# Patient Record
Sex: Female | Born: 1965 | Race: White | Hispanic: No | Smoking: Current every day smoker
Health system: Southern US, Community
[De-identification: ages and names within clinical notes are randomized; demographics above are authoritative.]

## PROBLEM LIST (undated history)

## (undated) DIAGNOSIS — N809 Endometriosis, unspecified: Secondary | ICD-10-CM

## (undated) DIAGNOSIS — B009 Herpesviral infection, unspecified: Secondary | ICD-10-CM

## (undated) DIAGNOSIS — K200 Eosinophilic esophagitis: Secondary | ICD-10-CM

## (undated) DIAGNOSIS — K5190 Ulcerative colitis, unspecified, without complications: Secondary | ICD-10-CM

## (undated) DIAGNOSIS — K2289 Other specified disease of esophagus: Secondary | ICD-10-CM

## (undated) DIAGNOSIS — L409 Psoriasis, unspecified: Secondary | ICD-10-CM

## (undated) DIAGNOSIS — K228 Other specified diseases of esophagus: Secondary | ICD-10-CM

## (undated) DIAGNOSIS — K219 Gastro-esophageal reflux disease without esophagitis: Secondary | ICD-10-CM

## (undated) DIAGNOSIS — E785 Hyperlipidemia, unspecified: Secondary | ICD-10-CM

## (undated) DIAGNOSIS — J45909 Unspecified asthma, uncomplicated: Secondary | ICD-10-CM

## (undated) HISTORY — DX: Ulcerative colitis, unspecified, without complications: K51.90

## (undated) HISTORY — DX: Endometriosis, unspecified: N80.9

## (undated) HISTORY — DX: Other specified disease of esophagus: K22.89

## (undated) HISTORY — PX: SHOULDER ARTHROSCOPY: SHX128

## (undated) HISTORY — DX: Psoriasis, unspecified: L40.9

## (undated) HISTORY — PX: CERVICAL DISC SURGERY: SHX588

## (undated) HISTORY — DX: Herpesviral infection, unspecified: B00.9

## (undated) HISTORY — DX: Eosinophilic esophagitis: K20.0

## (undated) HISTORY — DX: Hyperlipidemia, unspecified: E78.5

## (undated) HISTORY — DX: Unspecified asthma, uncomplicated: J45.909

## (undated) HISTORY — DX: Gastro-esophageal reflux disease without esophagitis: K21.9

## (undated) HISTORY — DX: Other specified diseases of esophagus: K22.8

---

## 1988-06-21 HISTORY — PX: LAPAROSCOPY: SHX197

## 1997-06-21 HISTORY — PX: TUBAL LIGATION: SHX77

## 2006-08-11 LAB — PULMONARY FUNCTION TEST

## 2007-06-15 ENCOUNTER — Emergency Department (HOSPITAL_COMMUNITY): Admission: EM | Admit: 2007-06-15 | Discharge: 2007-06-15 | Payer: Self-pay | Admitting: General Surgery

## 2007-10-18 ENCOUNTER — Emergency Department (HOSPITAL_COMMUNITY): Admission: EM | Admit: 2007-10-18 | Discharge: 2007-10-18 | Payer: Self-pay | Admitting: Emergency Medicine

## 2010-11-18 ENCOUNTER — Other Ambulatory Visit: Payer: Self-pay | Admitting: Obstetrics and Gynecology

## 2010-11-18 ENCOUNTER — Encounter (HOSPITAL_COMMUNITY): Payer: 59

## 2010-11-18 LAB — CBC
HCT: 44.6 % (ref 36.0–46.0)
MCH: 29.8 pg (ref 26.0–34.0)
MCV: 89.7 fL (ref 78.0–100.0)
Platelets: 223 10*3/uL (ref 150–400)
RDW: 12.7 % (ref 11.5–15.5)
WBC: 6.5 10*3/uL (ref 4.0–10.5)

## 2010-11-19 NOTE — H&P (Addendum)
  [name], [name]               ACCOUNT NO.:  [id number]  MEDICAL RECORD NO.:  [id number]           PATIENT TYPE:  O  LOCATION:  SDC                           [facility]:  [facility]  PHYSICIAN:  Guy Sandifer. Henderson Cloud, M.D. DATE OF BIRTH:  1965/12/22  DATE OF ADMISSION:  11/18/2010 DATE OF DISCHARGE:                             HISTORY & PHYSICAL   CHIEF COMPLAINT:  Heavy painful menses.  HISTORY OF PRESENT ILLNESS:  This patient is a 45 year old divorced white female G2, P2 status post tubal ligation who is diagnosed with endometriosis prior to the birth of her children.  She has had recurrent significant pain before and during each menses.  She is also having increasingly heavy menstrual flows, sometimes soaking a pad every 10 minutes, making it difficult to get up from the toilet or leave the house and is interfering with work.  Ultrasound in my office on April 01, 2010 revealed uterus measuring 9.1 x 5.8 x 6.6 cm.  There is at least one 16-mm fibroid noted.  After careful discussion of the options, she is being admitted for laparoscopically-assisted vaginal hysterectomy and removal of the tube and ovary if abnormal.  PAST MEDICAL HISTORY: 1. Microscopic hematuria with benign evaluation with Dr. Sherron Monday in     2011. 2. Psoriasis. 3. Overactive bladder. 4. Irritable bowel syndrome. 5. Exercise-induced asthma with occasional rescue inhaler.  MEDICATIONS:  Enablex 7.5 mg daily.  __________ 0.05% cream p.r.n. psoriasis.  PAST SURGICAL HISTORY: 1. D and C. 2. Laparoscopy x2. 3. Postpartum bilateral tubal ligation. 4. Colonoscopy.  FAMILY HISTORY:  Positive for ulcerative colitis and arthritis.  SOCIAL HISTORY:  Smokes half-pack of cigarettes a day.  Denies drug or alcohol abuse.  REVIEW OF SYSTEMS:  NEURO:  Denies headache.  CARDIAC:  Denies chest pain.  PULMONARY:  Denies shortness of breath.  PHYSICAL EXAMINATION:  VITAL SIGNS:  Height 5 feet 5 inches, weight  187 pounds, blood pressure 126/82. LUNGS:  Clear to auscultation. HEART:  Regular rate and rhythm. BACK:  No CVA tenderness. BREASTS:  No mass, retraction, or discharge. ABDOMEN:  Soft, nontender without masses. PELVIC:  Vulva, vagina, and cervix without lesion.  Uterus is 8 weeks in size, mobile, nontender.  Adnexa nontender without masses. EXTREMITIES:  Grossly within normal limits. NEUROLOGICAL:  Grossly within normal limits.  ASSESSMENT:  Menorrhagia, dysmenorrhea.  PLAN:  Laparoscopically-assisted vaginal hysterectomy and removal of the tube and ovary if abnormal.     Guy Sandifer. Henderson Cloud, M.D.     JET/MEDQ  D:  11/18/2010  T:  11/19/2010  Job:  [id number]  Electronically Signed by Harold Hedge M.D. on 11/19/2010 01:16:19 PM

## 2010-11-20 HISTORY — PX: ABDOMINAL HYSTERECTOMY: SHX81

## 2010-11-26 ENCOUNTER — Other Ambulatory Visit: Payer: Self-pay | Admitting: Obstetrics and Gynecology

## 2010-11-26 ENCOUNTER — Ambulatory Visit (HOSPITAL_COMMUNITY)
Admission: RE | Admit: 2010-11-26 | Discharge: 2010-11-27 | Disposition: A | Discharge status: Home / Self Care | Payer: 59 | Source: Ambulatory Visit | Attending: Obstetrics and Gynecology | Admitting: Obstetrics and Gynecology

## 2010-11-26 DIAGNOSIS — Z01818 Encounter for other preprocedural examination: Secondary | ICD-10-CM | POA: Insufficient documentation

## 2010-11-26 DIAGNOSIS — Z01812 Encounter for preprocedural laboratory examination: Secondary | ICD-10-CM | POA: Insufficient documentation

## 2010-11-26 DIAGNOSIS — N946 Dysmenorrhea, unspecified: Secondary | ICD-10-CM | POA: Insufficient documentation

## 2010-11-26 DIAGNOSIS — N920 Excessive and frequent menstruation with regular cycle: Secondary | ICD-10-CM | POA: Insufficient documentation

## 2010-11-27 LAB — CBC
HCT: 35 % — ABNORMAL LOW (ref 36.0–46.0)
MCH: 29.2 pg (ref 26.0–34.0)
MCV: 92.1 fL (ref 78.0–100.0)
Platelets: 206 10*3/uL (ref 150–400)
RBC: 3.8 MIL/uL — ABNORMAL LOW (ref 3.87–5.11)
RDW: 13 % (ref 11.5–15.5)
WBC: 13.1 10*3/uL — ABNORMAL HIGH (ref 4.0–10.5)

## 2010-12-18 NOTE — Discharge Summary (Signed)
  NAMELAQUIA, [name]               ACCOUNT NO.:  [id number]  MEDICAL RECORD NO.:  [id number]  LOCATION:  9310                          [facility]ILITY:  [facility]  PHYSICIAN:  Guy Sandifer. Henderson Cloud, M.D. DATE OF BIRTH:  21-Feb-1966  DATE OF ADMISSION:  11/26/2010 DATE OF DISCHARGE:  11/27/2010                              DISCHARGE SUMMARY   ADMITTING DIAGNOSES: 1. Menorrhagia. 2. Dysmenorrhea.  DISCHARGE DIAGNOSES: 1. Menorrhagia. 2. Dysmenorrhea.  PROCEDURE:  On November 26, 2010, laparoscopically-assisted vaginal hysterectomy.  REASON FOR ADMISSION:  This patient is a 45 year old white female status post tubal ligation with increasingly heavy and painful menses.  Details are dictated in history and physical.  She is admitted for surgical management.  HOSPITAL COURSE:  The patient is admitted to the hospital and undergoes the above procedure.  Estimated blood loss was 150 mL.  On the day of discharge, she has good pain relief.  She is passing flatus, tolerating regular diet, and ambulating well.  Vital signs are stable.  She is afebrile.  Hemoglobin is 11.1 and pathology is pending.  CONDITION ON DISCHARGE:  Good.  DIET:  Regular as tolerated.  ACTIVITY:  No lifting, no operation of automobiles, and no vaginal entry.  DISCHARGE INSTRUCTIONS:  She is to call the office for problems including not limited to heavy vaginal bleeding, increasing pain, persistent nausea, vomiting, or temperature of 101 degrees.  FOLLOWUP:  Follow up in the office in 2 weeks.  MEDICATIONS: 1. Percocet 5/325 mg #40 one to two p.o. q.6 h. p.r.n. 2. Ibuprofen 600 mg q.6 h. p.r.n.     Guy Sandifer Henderson Cloud, M.D.     JET/MEDQ  D:  11/27/2010  T:  11/28/2010  Job:  [id number]  Electronically Signed by Harold Hedge M.D. on 12/18/2010 09:14:23 AM

## 2010-12-18 NOTE — Op Note (Signed)
[name], [name]               ACCOUNT NO.:  [id number]  MEDICAL RECORD NO.:  [id number]  LOCATION:  9310                          [facility]ILITY:  [facility]  PHYSICIAN:  Guy Sandifer. Henderson Cloud, M.D. DATE OF BIRTH:  08-Nov-1965  DATE OF PROCEDURE:  11/26/2010 DATE OF DISCHARGE:                              OPERATIVE REPORT   PREOPERATIVE DIAGNOSES: 1. Menorrhagia. 2. Dysmenorrhea.  POSTOPERATIVE DIAGNOSES: 1. Menorrhagia. 2. Dysmenorrhea.  PROCEDURE:  Laparoscopically-assisted vaginal hysterectomy.  SURGEON:  Guy Sandifer. Henderson Cloud, MD  ASSISTANT:  Freddy Finner, MD  ANESTHESIA:  General with endotracheal intubation.  SPECIMENS:  Uterus to Pathology.  ESTIMATED BLOOD LOSS:  150 mL.  INDICATIONS AND CONSENT:  The patient is a 45 year old white female G2, P2 status post tubal ligation with heavy painful menses.  Details are dictated in the history and physical.  She was admitted for laparoscopically-assisted vaginal hysterectomy.  Tubes and ovaries will be removed only if distinctly abnormal.  Potential risks and complications have been discussed preoperatively including but not limited to infection, organ damage, bleeding requiring transfusion of blood products with HIV and hepatitis acquisition, DVT, PE, pneumonia, fistula formation, pelvic pain, painful intercourse, laparotomy.  All questions have been answered and consent was signed on the chart.  FINDINGS:  Upper abdomen is grossly normal.  Uterus is retroverted about 10 weeks in size.  Tubes and ovaries are normal.  The cul-de-sac is clean.  DESCRIPTION OF PROCEDURE:  The patient was taken to the operating room where she was identified, placed in the dorsal supine position, and general anesthesia was induced via endotracheal intubation.  She was placed in the dorsal lithotomy position.  Time-out was undertaken.  She was prepped, bladder was straight drained.  Hulka tenaculum was placed in the uterus as a manipulator and she was  draped in a sterile fashion. The infraumbilical and suprapubic areas were injected in the midline with approximately 5 mL total of 0.5% plain Marcaine.  A small infraumbilical incision was made.  A disposable Veress needle was placed on the first attempt without difficulty.  A good syringe and drop test are noted.  A 2 liters of gas were then insufflated under low pressure with good tympany in the right upper quadrant.  A 10/11 Xcel bladeless disposable trocar sleeve was then placed using direct visualization with the diagnostic laparoscope.  The operative laparoscope was then used.  A small suprapubic incision was made and a 5-mm Xcel bladeless disposable trocar sleeve was placed under direct visualization without difficulty. The above findings were noted.  Then using the EnSeal bipolar cautery cutting instrument, the proximal ligaments were taken down bilaterally to the level of the vesicouterine peritoneum.  Vesicouterine peritoneum was taken down cephalad laterally.  Good hemostasis was noted. Instruments were removed.  Suprapubic trocar sleeve was removed and attention was turned to the vagina.  Posterior cul-de-sac was entered sharply and the cervix was circumscribed with unipolar cautery.  Mucosa was advanced sharply and bluntly.  Then, using the gyrus handheld bipolar cautery instrument, the uterosacral ligaments followed by the bladder pillars, cardinal ligaments, and uterine vessels were taken bilaterally.  Fundus was delivered posteriorly.  Anterior peritoneum and the cul-de-sac was then taken  down without difficulty.  Specimens delivered and sent to Pathology.  All suture will be the 0 Monocryl unless otherwise designated.  Uterosacral ligaments then ligated to the vaginal cuff bilaterally.  A suture was placed at 3 and 9 o'clock position on the vaginal cuff to ensure hemostasis.  Uterosacral ligaments were then plicated in the midline with a third suture.  Cuff was closed  with figure-of-eights.  Foley catheter was placed and clear urine was noted.  Attention was returned to the abdomen. Pneumoperitoneum was recreated and the suprapubic trocar sleeve was reintroduced under direct visualization.  Irrigation was carried out. Minor bleeding was controlled at peritoneal edges with bipolar cautery. Inspection under reduced pneumoperitoneum reveals continued hemostasis. The remaining 25 mL of 0.5% plain Marcaine was instilled into the peritoneal cavity.  Instruments were removed.  Skin was closed with interrupted 3-0 Vicryl in the umbilical incision.  Both incisions were closed with glue.  All counts were correct.  The patient was awakened, taken to the recovery room in stable condition.     Guy Sandifer Henderson Cloud, M.D.     JET/MEDQ  D:  11/26/2010  T:  11/27/2010  Job:  [id number]  Electronically Signed by Harold Hedge M.D. on 12/18/2010 09:14:19 AM

## 2011-01-18 ENCOUNTER — Encounter: Payer: Self-pay | Admitting: Internal Medicine

## 2011-02-11 ENCOUNTER — Ambulatory Visit: Payer: 59 | Admitting: Internal Medicine

## 2011-03-10 ENCOUNTER — Other Ambulatory Visit (HOSPITAL_COMMUNITY): Payer: Self-pay | Admitting: Physician Assistant

## 2011-03-10 ENCOUNTER — Ambulatory Visit (HOSPITAL_COMMUNITY)
Admission: RE | Admit: 2011-03-10 | Discharge: 2011-03-10 | Disposition: A | Discharge status: Home / Self Care | Payer: 59 | Source: Ambulatory Visit | Attending: Physician Assistant | Admitting: Physician Assistant

## 2011-03-10 DIAGNOSIS — M25549 Pain in joints of unspecified hand: Secondary | ICD-10-CM

## 2011-03-10 DIAGNOSIS — X58XXXA Exposure to other specified factors, initial encounter: Secondary | ICD-10-CM | POA: Insufficient documentation

## 2011-03-10 DIAGNOSIS — S62639A Displaced fracture of distal phalanx of unspecified finger, initial encounter for closed fracture: Secondary | ICD-10-CM | POA: Insufficient documentation

## 2012-02-01 ENCOUNTER — Encounter: Payer: Self-pay | Admitting: Internal Medicine

## 2012-03-06 ENCOUNTER — Encounter: Payer: Self-pay | Admitting: *Deleted

## 2012-03-07 ENCOUNTER — Encounter: Payer: Self-pay | Admitting: *Deleted

## 2012-03-21 ENCOUNTER — Encounter: Payer: Self-pay | Admitting: *Deleted

## 2012-03-28 ENCOUNTER — Ambulatory Visit (INDEPENDENT_AMBULATORY_CARE_PROVIDER_SITE_OTHER): Payer: 59 | Admitting: Internal Medicine

## 2012-03-28 ENCOUNTER — Encounter: Payer: Self-pay | Admitting: Internal Medicine

## 2012-03-28 VITALS — BP 132/78 | HR 68 | Ht 65.0 in | Wt 206.0 lb

## 2012-03-28 DIAGNOSIS — K5190 Ulcerative colitis, unspecified, without complications: Secondary | ICD-10-CM

## 2012-03-28 DIAGNOSIS — K200 Eosinophilic esophagitis: Secondary | ICD-10-CM

## 2012-03-28 MED ORDER — MESALAMINE 1000 MG RE SUPP: 1000.0000 mg | Freq: Every day | RECTAL | Status: DC

## 2012-03-28 MED ORDER — MOVIPREP 100 G PO SOLR: 1.0000 | Freq: Once | ORAL | Status: DC

## 2012-03-28 MED ORDER — OMEPRAZOLE 40 MG PO CPDR: 40.0000 mg | DELAYED_RELEASE_CAPSULE | Freq: Every day | ORAL | Status: DC

## 2012-03-28 NOTE — Progress Notes (Signed)
[name] 03/22/1966 MRN [id number]        History of Present Illness:  This is a 46-year-old white female with history of ulcerative colitis/proctitis followed at [facility] up until 2007 I have reviewed her extensive medical records - 1 hour). She also has a history of eosinophilic esophagitis which was diagnosed in [place]. She moved to [place] in 2008 and has been asymptomatic. She has not taken any medications for inflammatory bowel disease or for her esophagitis.for 6 years. However, In the last several months her symptoms have started to flareup, specifically burning chest pain and change in bowel habits , urgency and occasional diarrhea. She denies rectal bleeding. Last colonoscopy in September 2007 showed  active colitis from 0-19 cm characterized by edema, friability and granular mucosa, Her upper endoscopy in October 2007 showed esophagitis with increased eosinophils greater than 20 per high power field. She was seeing an allergist for IgE mediated esophagitis and asthma. Her asthma has been under good control.   Past Medical History  Diagnosis Date  . GERD (gastroesophageal reflux disease)   . Feline esophagus   . Ulcerative colitis   . Psoriasis   . HSV-2 (herpes simplex virus 2) infection   . Hyperlipidemia   . Endometriosis   . Asthma   . Eosinophilic esophagitis    Past Surgical History  Procedure Date  . Abdominal hysterectomy 11/2010  . Tubal ligation 1999  . Laparoscopy 1990    For endometriosis     reports that she has quit smoking. She has never used smokeless tobacco. She reports that she does not drink alcohol or use illicit drugs. family history includes Esophageal cancer in her paternal grandfather and Ulcerative colitis in her maternal grandfather.  There is no history of Colon cancer. Allergies  Allergen Reactions  . Sulfa Antibiotics Anaphylaxis        Review of Systems: Negative for dysphagia. Positive for heartburn and mild source,  positive for weight gain of 26 pounds  The remainder of the 10 point ROS is negative except as outlined in H&P   Physical Exam: General appearance  Well developed, in no distress. Eyes- non icteric. HEENT nontraumatic, normocephalic. Mouth small ulcer at the lower lip, tongue papillated, no cheilosis. Neck supple without adenopathy, thyroid not enlarged, no carotid bruits, no JVD. Lungs Clear to auscultation bilaterally. Cor normal S1, normal S2, regular rhythm, no murmur,  quiet precordium. Abdomen: Obese soft with mild tenderness in left lower quadrant. Upper abdomen and right middle quadrant unremarkable. Liver edge at costal margin Rectal: And anoscopic exam reveals saw normal perianal area. Normal rectal sphincter tone. Mucosa of the rectal ampulla appears essentially normal possibly mildly hyperemic but no granularity or bleeding. There is small amount of mucus, stool is Hemoccult negative Extremities no pedal edema. Skin no lesions. Neurological alert and oriented x 3. Psychological normal mood and affect.  Assessment and Plan:  46-year-old white female with history of ulcerative colitis/proctitiswith recent flareup of her symptoms of fecal urgency and change in bowel habits. Anoscopic exam today does not: confirm active  Disease, but she may have microscopic disease or inflammation more proximally in her colon. Since it has been 6 years since last colonoscopy we will  proceed with colonoscopy and random biopsies.  Eosinophilic esophagitis by history. Now exacerbation of  reflux symptoms and burning. No symptoms of stricture although prior endoscopy showed Schatzki's ring, She has an aphthous ulcer on her lower lip, we will start her on Prilosec 20 mg daily   and schedule upper endoscopy with biopsies   03/28/2012 [name]  

## 2012-03-28 NOTE — Patient Instructions (Addendum)
You have been scheduled for an endoscopy/colonoscopy. Please follow written instructions given to you at your visit today. If you use inhalers (even only as needed), please bring them with you on the day of your procedure. We have sent the following medications to your pharmacy for you to pick up at your convenience: Prilosec Canasa CC: Dr Assunta Found

## 2012-04-12 NOTE — Interval H&P Note (Signed)
History and Physical Interval Note:  04/12/2012 11:06 PM  [name]  has presented today for surgery, with the diagnosis of eosinophilic esophagitis, ulcerative colitis  The various methods of treatment have been discussed with the patient and family. After consideration of risks, benefits and other options for treatment, the patient has consented to  Procedure(s) (LRB) with comments: ESOPHAGOGASTRODUODENOSCOPY (EGD) (N/A) COLONOSCOPY (N/A) as a surgical intervention .  The patient's history has been reviewed, patient examined, no change in status, stable for surgery.  I have reviewed the patient's chart and labs.  Questions were answered to the patient's satisfaction.     Lina Sar

## 2012-04-12 NOTE — H&P (View-Only) (Signed)
ULYSSES DEWAARD 1966-04-03 MRN [id number]        History of Present Illness:  This is a 46 year old white female with history of ulcerative colitis/proctitis followed at [facility] clinic up until 2007 I have reviewed her extensive medical records - 1 hour). She also has a history of eosinophilic esophagitis which was diagnosed in [place]. She moved to [place] in 2008 and has been asymptomatic. She has not taken any medications for inflammatory bowel disease or for her esophagitis.for 6 years. However, In the last several months her symptoms have started to flareup, specifically burning chest pain and change in bowel habits , urgency and occasional diarrhea. She denies rectal bleeding. Last colonoscopy in September 2007 showed  active colitis from 0-19 cm characterized by edema, friability and granular mucosa, Her upper endoscopy in October 2007 showed esophagitis with increased eosinophils greater than 20 per high power field. She was seeing an allergist for IgE mediated esophagitis and asthma. Her asthma has been under good control.   Past Medical History  Diagnosis Date  . GERD (gastroesophageal reflux disease)   . Feline esophagus   . Ulcerative colitis   . Psoriasis   . HSV-2 (herpes simplex virus 2) infection   . Hyperlipidemia   . Endometriosis   . Asthma   . Eosinophilic esophagitis    Past Surgical History  Procedure Date  . Abdominal hysterectomy 11/2010  . Tubal ligation 1999  . Laparoscopy 1990    For endometriosis     reports that she has quit smoking. She has never used smokeless tobacco. She reports that she does not drink alcohol or use illicit drugs. family history includes Esophageal cancer in her paternal grandfather and Ulcerative colitis in her maternal grandfather.  There is no history of Colon cancer. Allergies  Allergen Reactions  . Sulfa Antibiotics Anaphylaxis        Review of Systems: Negative for dysphagia. Positive for heartburn and mild source,  positive for weight gain of 26 pounds  The remainder of the 10 point ROS is negative except as outlined in H&P   Physical Exam: General appearance  Well developed, in no distress. Eyes- non icteric. HEENT nontraumatic, normocephalic. Mouth small ulcer at the lower lip, tongue papillated, no cheilosis. Neck supple without adenopathy, thyroid not enlarged, no carotid bruits, no JVD. Lungs Clear to auscultation bilaterally. Cor normal S1, normal S2, regular rhythm, no murmur,  quiet precordium. Abdomen: Obese soft with mild tenderness in left lower quadrant. Upper abdomen and right middle quadrant unremarkable. Liver edge at costal margin Rectal: And anoscopic exam reveals saw normal perianal area. Normal rectal sphincter tone. Mucosa of the rectal ampulla appears essentially normal possibly mildly hyperemic but no granularity or bleeding. There is small amount of mucus, stool is Hemoccult negative Extremities no pedal edema. Skin no lesions. Neurological alert and oriented x 3. Psychological normal mood and affect.  Assessment and Plan:  46 year old white female with history of ulcerative colitis/proctitiswith recent flareup of her symptoms of fecal urgency and change in bowel habits. Anoscopic exam today does not: confirm active  Disease, but she may have microscopic disease or inflammation more proximally in her colon. Since it has been 6 years since last colonoscopy we will  proceed with colonoscopy and random biopsies.  Eosinophilic esophagitis by history. Now exacerbation of  reflux symptoms and burning. No symptoms of stricture although prior endoscopy showed Schatzki's ring, She has an aphthous ulcer on her lower lip, we will start her on Prilosec 20 mg daily  and schedule upper endoscopy with biopsies   03/28/2012 Lina Sar

## 2012-04-13 ENCOUNTER — Encounter (HOSPITAL_COMMUNITY): Payer: Self-pay | Admitting: *Deleted

## 2012-04-13 ENCOUNTER — Encounter (HOSPITAL_COMMUNITY): Payer: Self-pay

## 2012-04-13 ENCOUNTER — Ambulatory Visit (HOSPITAL_COMMUNITY)
Admission: RE | Admit: 2012-04-13 | Discharge: 2012-04-13 | Disposition: A | Discharge status: Home / Self Care | Payer: 59 | Source: Ambulatory Visit | Attending: Internal Medicine | Admitting: Internal Medicine

## 2012-04-13 ENCOUNTER — Encounter (HOSPITAL_COMMUNITY)
Admission: RE | Disposition: A | Discharge status: Home / Self Care | Payer: Self-pay | Source: Ambulatory Visit | Attending: Internal Medicine

## 2012-04-13 DIAGNOSIS — K319 Disease of stomach and duodenum, unspecified: Secondary | ICD-10-CM | POA: Insufficient documentation

## 2012-04-13 DIAGNOSIS — K5190 Ulcerative colitis, unspecified, without complications: Secondary | ICD-10-CM | POA: Insufficient documentation

## 2012-04-13 DIAGNOSIS — K130 Diseases of lips: Secondary | ICD-10-CM | POA: Insufficient documentation

## 2012-04-13 DIAGNOSIS — E785 Hyperlipidemia, unspecified: Secondary | ICD-10-CM | POA: Insufficient documentation

## 2012-04-13 DIAGNOSIS — K5100 Ulcerative (chronic) pancolitis without complications: Secondary | ICD-10-CM

## 2012-04-13 DIAGNOSIS — R152 Fecal urgency: Secondary | ICD-10-CM | POA: Insufficient documentation

## 2012-04-13 DIAGNOSIS — D721 Eosinophilia, unspecified: Secondary | ICD-10-CM | POA: Insufficient documentation

## 2012-04-13 DIAGNOSIS — R198 Other specified symptoms and signs involving the digestive system and abdomen: Secondary | ICD-10-CM | POA: Insufficient documentation

## 2012-04-13 DIAGNOSIS — K200 Eosinophilic esophagitis: Secondary | ICD-10-CM

## 2012-04-13 DIAGNOSIS — K219 Gastro-esophageal reflux disease without esophagitis: Secondary | ICD-10-CM | POA: Insufficient documentation

## 2012-04-13 DIAGNOSIS — R0789 Other chest pain: Secondary | ICD-10-CM | POA: Insufficient documentation

## 2012-04-13 HISTORY — PX: ESOPHAGOGASTRODUODENOSCOPY: SHX5428

## 2012-04-13 HISTORY — PX: COLONOSCOPY: SHX5424

## 2012-04-13 SURGERY — EGD (ESOPHAGOGASTRODUODENOSCOPY)
Anesthesia: Moderate Sedation

## 2012-04-13 MED ORDER — MIDAZOLAM HCL 5 MG/5ML IJ SOLN
INTRAMUSCULAR | Status: DC | PRN
  Administered 2012-04-13 (×5): 2 mg via INTRAVENOUS
  Administered 2012-04-13: 1 mg via INTRAVENOUS
  Administered 2012-04-13 (×2): 2 mg via INTRAVENOUS

## 2012-04-13 MED ORDER — DIPHENHYDRAMINE HCL 50 MG/ML IJ SOLN
INTRAMUSCULAR | Status: DC | PRN
  Administered 2012-04-13: 25 mg via INTRAVENOUS

## 2012-04-13 MED ORDER — DIPHENHYDRAMINE HCL 50 MG/ML IJ SOLN
INTRAMUSCULAR | Status: AC
  Filled 2012-04-13: qty 1

## 2012-04-13 MED ORDER — MIDAZOLAM HCL 10 MG/2ML IJ SOLN
INTRAMUSCULAR | Status: AC
  Filled 2012-04-13: qty 4

## 2012-04-13 MED ORDER — BUTAMBEN-TETRACAINE-BENZOCAINE 2-2-14 % EX AERO
INHALATION_SPRAY | CUTANEOUS | Status: DC | PRN
  Administered 2012-04-13: 2 via TOPICAL

## 2012-04-13 MED ORDER — SODIUM CHLORIDE 0.9 % IV SOLN
INTRAVENOUS | Status: DC
  Administered 2012-04-13: 08:00:00 via INTRAVENOUS

## 2012-04-13 MED ORDER — FENTANYL CITRATE 0.05 MG/ML IJ SOLN
INTRAMUSCULAR | Status: DC | PRN
  Administered 2012-04-13 (×7): 25 ug via INTRAVENOUS

## 2012-04-13 MED ORDER — FENTANYL CITRATE 0.05 MG/ML IJ SOLN
INTRAMUSCULAR | Status: AC
  Filled 2012-04-13: qty 4

## 2012-04-13 NOTE — Op Note (Signed)
[facility] [address] [place], [address]   ENDOSCOPY PROCEDURE REPORT  PATIENT: [name], [name]  MR#: [id number] BIRTHDATE: Jan 08, 1966 , 46  yrs. old GENDER: Female ENDOSCOPIST: Hart Carwin, MD REFERRED BY:  Assunta Found, M.D. PROCEDURE DATE:  04/13/2012 PROCEDURE:  EGD w/ biopsy ASA CLASS:     Class I INDICATIONS:  hx of GERD, eosinophillic esophagitis on last EGD in 2007, also had Schatzki's ring. MEDICATIONS: These medications were titrated to patient response per physician's verbal order, Versed 10 mg IV, and Fentanyl 100 mcg IV  TOPICAL ANESTHETIC: Cetacaine Spray  DESCRIPTION OF PROCEDURE: After the risks benefits and alternatives of the procedure were thoroughly explained, informed consent was obtained.  The Pentax Gastroscope [id number] endoscope was introduced through the mouth and advanced to the second portion of the duodenum. Without limitations.  The instrument was slowly withdrawn as the mucosa was fully examined.    The upper, middle and distal third of the esophagus were carefully inspected and no abnormalities were noted.  The z-line was well seen at the GEJ. Biopsies were taken from g-e junction to r/o eosinophillia The endoscope was pushed into the fundus which was normal including a retroflexed view.  The antrum, gastric body, first and second part of the duodenum were unremarkable. Retroflexed views revealed no abnormalities.     The scope was then withdrawn from the patient and the procedure completed.  COMPLICATIONS: There were no complications. ENDOSCOPIC IMPRESSION: Normal EGD; biopsy at the g-e junction no evidence of esophageal stricture  RECOMMENDATIONS: 1.  anti-reflux regimen to be follow 2.  continue PPI prn 3.  await biopsy results  REPEAT EXAM: no recall  eSigned:  Hart Carwin, MD 04/13/2012 9:32 AM   CC:

## 2012-04-13 NOTE — Op Note (Signed)
[facility] [address] [place], [address]   COLONOSCOPY PROCEDURE REPORT  PATIENT: [name], [name]  MR#: [id number] BIRTHDATE: [birth year] , 46  yrs. old GENDER: Female ENDOSCOPIST: Hart Carwin, MD REFERRED BY:  Assunta Found, M.D. PROCEDURE DATE:  04/13/2012 PROCEDURE:   Colonoscopy with biopsy ASA CLASS:   Class I INDICATIONS:high risk patient with previously diagnosed UC proctitis and hx ulcerative proctitis 0- 19 cm on last colon 2007,,now increased symptoms. MEDICATIONS: These medications were titrated to patient response per physician's verbal order, Versed-Detailed 5 mg IV, and Fentanyl 75 mcg IV  DESCRIPTION OF PROCEDURE:   After the risks and benefits and of the procedure were explained, informed consent was obtained.  A digital rectal exam revealed no abnormalities of the rectum.    The Pentax Ped Colon [id number]  endoscope was introduced through the anus and advanced to the cecum, which was identified by both the appendix and ileocecal valve .  The quality of the prep was excellent, using MoviPrep .  The instrument was then slowly withdrawn as the colon was fully examined.     COLON FINDINGS: A normal appearing cecum, ileocecal valve, and appendiceal orifice were identified.  The ascending, hepatic flexure, transverse, splenic flexure, descending, sigmoid colon and rectum appeared unremarkable.Random biopsies were taken from #2 right colon, #3 left colon 20-60cm, and #4 rectosigmoid 0-20 cm. No polyps or cancers were seen.     Retroflexed views revealed no abnormalities.     The scope was then withdrawn from the patient and the procedure completed.  COMPLICATIONS: There were no complications. ENDOSCOPIC IMPRESSION: normal colonoscopy to the cecum, random biopsies of the right, left and sigmoid colon, no evidence of UC RECOMMENDATIONS: Await pathology results Use Canasa supp prn high fiber diet   REPEAT EXAM: In 5 year(s)  for  Colonoscopy.  cc:  _______________________________ eSignedHart Carwin, MD 04/13/2012 9:41 AM     PATIENT NAME:  [name], [name] MR#: [id number]

## 2012-04-15 ENCOUNTER — Encounter: Payer: Self-pay | Admitting: Internal Medicine

## 2012-04-17 ENCOUNTER — Encounter (HOSPITAL_COMMUNITY): Payer: Self-pay | Admitting: Internal Medicine

## 2013-06-21 LAB — HM PAP SMEAR: HM PAP: NORMAL

## 2014-12-31 ENCOUNTER — Ambulatory Visit (INDEPENDENT_AMBULATORY_CARE_PROVIDER_SITE_OTHER): Payer: 59

## 2014-12-31 ENCOUNTER — Ambulatory Visit (INDEPENDENT_AMBULATORY_CARE_PROVIDER_SITE_OTHER): Payer: 59 | Admitting: Family Medicine

## 2014-12-31 VITALS — BP 114/82 | HR 76 | Temp 98.4°F | Resp 16 | Ht 65.0 in | Wt 200.6 lb

## 2014-12-31 DIAGNOSIS — M7712 Lateral epicondylitis, left elbow: Secondary | ICD-10-CM

## 2014-12-31 DIAGNOSIS — M722 Plantar fascial fibromatosis: Secondary | ICD-10-CM

## 2014-12-31 DIAGNOSIS — M25561 Pain in right knee: Secondary | ICD-10-CM

## 2014-12-31 MED ORDER — MELOXICAM 15 MG PO TABS: 15.0000 mg | ORAL_TABLET | Freq: Every day | ORAL | Status: DC

## 2014-12-31 NOTE — Patient Instructions (Addendum)
You have plantar fasciitis of your left foot- this is an inflammation and tearing of the supporting fibers in the sole of your foot Try to stretch your foot prior to getting out of bed in the am.  Supportive shoes like tennis shoes can also be helpful Padded heel cups in your shoes can help with comfort.    You have lateral epicondylitis (tennis elbow) in the left elbow  Right knee:  Wear your hinged knee brace as needed for support  Use the mobic once a day as needed for pains [facility] further eval of your elbow and knee Let me know if you have any other problems in the meantime!

## 2014-12-31 NOTE — Progress Notes (Signed)
[facility] [address] [place] [address] [phone]  Date:  12/31/2014   Name:  [name]   DOB:  09/27/1965   MRN:  [id number]  PCP:  Colette Ribas, MD    Chief Complaint: Knee Pain; Arm Pain; and Foot Pain   History of Present Illness:  [name] is a 49 y.o. very pleasant female patient who presents with the following:  Here today as a new patient with complaint of various MSK pains About 6 months ago she noted pain in her left arm.  She had some pain in her wrist, and tried a wrist brace and then a tennis elbow brace.  She notes pain near her elbow, and weakness/ tingling in the hand.  This has gone on for months but is getting worse  Also over the last month she notes some generalized body aches. Her left foot hurts when she steps down on it, she has noted this for one month.  It is worst in the first am or if she has been sitting for a while and then stands up. Finally her right knee seems to be "going out" on her- will sometimes feel as thought it won't hold her when she stands.  It will click and pop, and feels "tight" but does not get stuck.  She has noted this off an on but more for the last 2 weeks She also notes tingling in her buttocks She is s/p hysterectomy I have seen her in the past when she had accompanied her husband following an injury.   She is not aware of any inciting injury that lead to these issues  S/p hysterectomy She has a history of UC but it has been under good control- "in remission" and not really bothering her lately  Patient Active Problem List   Diagnosis Date Noted  . 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 Reflux esophagitis 04/13/2012  . Esophageal reflux 04/13/2012    Past Medical History  Diagnosis Date  . GERD (gastroesophageal reflux disease)   . Feline esophagus   . Ulcerative colitis   . Psoriasis   . HSV-2 (herpes simplex virus 2) infection   . Hyperlipidemia   .  Endometriosis   . Asthma   . Eosinophilic esophagitis     Past Surgical History  Procedure Laterality Date  . Abdominal hysterectomy  11/2010  . Tubal ligation  1999  . Laparoscopy  1990    For endometriosis   . Esophagogastroduodenoscopy  04/13/2012    Procedure: ESOPHAGOGASTRODUODENOSCOPY (EGD);  Surgeon: Hart Carwin, MD;  Location: [facility];  Service: Endoscopy;  Laterality: N/A;  . Colonoscopy  04/13/2012    Procedure: COLONOSCOPY;  Surgeon: Hart Carwin, MD;  Location: [facility];  Service: Endoscopy;  Laterality: N/A;    History  Substance Use Topics  . Smoking status: Current Every Day Smoker -- 0.50 packs/day    Types: Cigarettes  . Smokeless tobacco: Never Used  . Alcohol Use: No    Family History  Problem Relation Age of Onset  . Ulcerative colitis Maternal Grandfather   . Heart disease Maternal Grandfather   . Esophageal cancer Paternal Grandfather   . Colon cancer Neg Hx   . Heart disease Maternal Grandmother     Allergies  Allergen Reactions  . Sulfa Antibiotics Anaphylaxis    Medication list has been reviewed and updated.  Current Outpatient Prescriptions on File Prior to Visit  Medication Sig Dispense Refill  .  fish oil-omega-3 fatty acids 1000 MG capsule Take 1 capsule by mouth at bedtime.    . mesalamine (CANASA) 1000 MG suppository Place 1 suppository (1,000 mg total) rectally at bedtime. (Patient not taking: Reported on 12/31/2014) 30 suppository 3  . omeprazole (PRILOSEC) 40 MG capsule Take 1 capsule (40 mg total) by mouth daily. (Patient not taking: Reported on 12/31/2014) 30 capsule 3   No current facility-administered medications on file prior to visit.    Review of Systems:  As per HPI- otherwise negative.   Physical Examination: Filed Vitals:   12/31/14 1038  BP: 114/82  Pulse: 76  Temp: 98.4 F (36.9 C)  Resp: 16   Filed Vitals:   12/31/14 1038  Height: 5\' 5"  (1.651 m)  Weight: 200 lb 9.6 oz (90.992 kg)   Body mass  index is 33.38 kg/(m^2). Ideal Body Weight: Weight in (lb) to have BMI = 25: 149.9  GEN: WDWN, NAD, Non-toxic, A & O x 3, overweight, looks well HEENT: Atraumatic, Normocephalic. Neck supple. No masses, No LAD. Ears and Nose: No external deformity. CV: RRR, No M/G/R. No JVD. No thrill. No extra heart sounds. PULM: CTA B, no wheezes, crackles, rhonchi. No retractions. No resp. distress. No accessory muscle use. EXTR: No c/c/e NEURO Normal gait.  PSYCH: Normally interactive. Conversant. Not depressed or anxious appearing.  Calm demeanor.  Tender over her left lateral epicondyle, and with resisted supination and pronation of her forearm Normal ROM of the elbow Tender over the left plantar fascia insertion on the heel.  The ankle and foot are negative Her right knee exam is normal except she has a bit of pain at the medial joint line and with valgus stress. Ligaments are stable, no effusion or redness   [facility] reading (PRIMARY) by  Dr. Patsy Lageropland. Right knee: negative  RIGHT KNEE - COMPLETE 4+ VIEW  COMPARISON: None.  FINDINGS: The bones of the right knee are adequately mineralized. The joint spaces are preserved. There is no significant osteophyte formation. The proximal fibula is intact. There is no joint effusion.  IMPRESSION: There is no acute or significant chronic bony abnormality of the right knee.  Assessment and Plan: Right knee pain - Plan: DG Knee Complete 4 Views Right, Ambulatory referral to Orthopedic Surgery, meloxicam (MOBIC) 15 MG tablet  Plantar fasciitis of left foot - Plan: meloxicam (MOBIC) 15 MG tablet  Tennis elbow, left - Plan: Ambulatory referral to Orthopedic Surgery, meloxicam (MOBIC) 15 MG tablet  Referral to ortho for her knee and chronic elbow issue Place in a hinged knee brace on the right Discussed strategies for managing her plantar fascia  Signed Abbe AmsterdamJessica Copland, MD

## 2015-11-21 ENCOUNTER — Telehealth: Payer: Self-pay | Admitting: Behavioral Health

## 2015-11-21 ENCOUNTER — Encounter: Payer: Self-pay | Admitting: Behavioral Health

## 2015-11-21 NOTE — Telephone Encounter (Signed)
Pre-Visit Call completed with patient and chart updated.   Pre-Visit Info documented in Specialty Comments under [employer].    

## 2015-11-24 ENCOUNTER — Encounter: Payer: Self-pay | Admitting: Family Medicine

## 2015-11-24 ENCOUNTER — Ambulatory Visit (INDEPENDENT_AMBULATORY_CARE_PROVIDER_SITE_OTHER): Payer: Managed Care, Other (non HMO) | Admitting: Family Medicine

## 2015-11-24 VITALS — BP 121/85 | HR 72 | Temp 98.3°F | Ht 65.5 in | Wt 205.0 lb

## 2015-11-24 DIAGNOSIS — R232 Flushing: Secondary | ICD-10-CM

## 2015-11-24 DIAGNOSIS — Z1239 Encounter for other screening for malignant neoplasm of breast: Secondary | ICD-10-CM | POA: Diagnosis not present

## 2015-11-24 DIAGNOSIS — Z8619 Personal history of other infectious and parasitic diseases: Secondary | ICD-10-CM | POA: Diagnosis not present

## 2015-11-24 DIAGNOSIS — D751 Secondary polycythemia: Secondary | ICD-10-CM

## 2015-11-24 DIAGNOSIS — N951 Menopausal and female climacteric states: Secondary | ICD-10-CM | POA: Diagnosis not present

## 2015-11-24 DIAGNOSIS — Z130 Encounter for screening for diseases of the blood and blood-forming organs and certain disorders involving the immune mechanism: Secondary | ICD-10-CM

## 2015-11-24 DIAGNOSIS — Z13220 Encounter for screening for lipoid disorders: Secondary | ICD-10-CM | POA: Diagnosis not present

## 2015-11-24 DIAGNOSIS — Z131 Encounter for screening for diabetes mellitus: Secondary | ICD-10-CM | POA: Diagnosis not present

## 2015-11-24 DIAGNOSIS — Z1329 Encounter for screening for other suspected endocrine disorder: Secondary | ICD-10-CM | POA: Diagnosis not present

## 2015-11-24 MED ORDER — VALACYCLOVIR HCL 500 MG PO TABS: ORAL_TABLET | ORAL | Status: DC

## 2015-11-24 NOTE — Progress Notes (Signed)
Pre visit review using our clinic review tool, if applicable. No additional management support is needed unless otherwise documented below in the visit note. 

## 2015-11-24 NOTE — Patient Instructions (Signed)
We will do all your basic labs today, and we will check an FSH level to see if you are likely in menopause Use the valtrex as needed for cold sores  We can plan to do a physical for you at your convenience

## 2015-11-24 NOTE — Progress Notes (Signed)
[facility] [address], [address] [place], [place] [address] [phone] [phone]  Date:  11/24/2015   Name:  Kim BrassLeshia A Lam   DOB:  04/26/1966   MRN:  [id number]  PCP:  Abbe AmsterdamOPLAND,Braelynn Lupton, MD    Chief Complaint: Establish Care   History of Present Illness:  [name] is a 50 y.o. very pleasant female patient who presents with the following:  Here today to establish care- she wants to get back on track with her annual visits.  She is s/p hysterectomy She just turned 50 yo and thought she should take better care of her health Her colonoscopy was done in 2013-she does have UC but this is quiet and she does not need any medication for this.   We will arrange a mammo for her as she is overdue She did have a brownie this am- has not eaten in 6 hours or so She has noted some hot flashes, night sweats.  Wonders if she is going through menopause She had a hyst in 2012 but does have one ovary  Patient Active Problem List   Diagnosis Date Noted  . Universal ulcerative (chronic) colitis 04/13/2012  . Reflux esophagitis 04/13/2012  . Esophageal reflux 04/13/2012    Past Medical History  Diagnosis Date  . GERD (gastroesophageal reflux disease)   . Feline esophagus   . Ulcerative colitis (HCC)   . Psoriasis   . HSV-2 (herpes simplex virus 2) infection   . Hyperlipidemia   . Endometriosis   . Asthma   . Eosinophilic esophagitis     Past Surgical History  Procedure Laterality Date  . Abdominal hysterectomy  11/2010  . Tubal ligation  1999  . Laparoscopy  1990    For endometriosis   . Esophagogastroduodenoscopy  04/13/2012    Procedure: ESOPHAGOGASTRODUODENOSCOPY (EGD);  Surgeon: Hart Carwinora M Brodie, MD;  [facility];  Service: Endoscopy;  Laterality: N/A;  . Colonoscopy  04/13/2012    Procedure: COLONOSCOPY;  Surgeon: Hart Carwinora M Brodie, MD;  Location: [facility];  Service: Endoscopy;  Laterality: N/A;    Social History   Substance Use Topics  . Smoking status: Current Every Day Smoker -- 0.50 packs/day    Types: Cigarettes  . Smokeless tobacco: Never Used  . Alcohol Use: No    Family History  Problem Relation Age of Onset  . Ulcerative colitis Maternal Grandfather   . Heart disease Maternal Grandfather   . Esophageal cancer Paternal Grandfather   . Colon cancer Neg Hx   . Heart disease Maternal Grandmother     Allergies  Allergen Reactions  . Sulfa Antibiotics Anaphylaxis    Medication list has been reviewed and updated.  Current Outpatient Prescriptions on File Prior to Visit  Medication Sig Dispense Refill  . fish oil-omega-3 fatty acids 1000 MG capsule Take 1 capsule by mouth at bedtime.    . meloxicam (MOBIC) 15 MG tablet Take 1 tablet (15 mg total) by mouth daily. Use as needed 30 tablet 0  . omeprazole (PRILOSEC) 40 MG capsule Take 1 capsule (40 mg total) by mouth daily. 30 capsule 3  . PREDNISONE, PAK, PO Take by mouth as needed.    . ValACYclovir HCl (VALTREX PO) Take 2 tablets by mouth as needed.     No current facility-administered medications on file prior to visit.    Review of Systems:  As per HPI- otherwise negative. She does get cold sores and needs a refill of  her prn valtrex   Physical Examination: Filed Vitals:   11/24/15 1608  BP: 121/85  Pulse: 72  Temp: 98.3 F (36.8 C)   Filed Vitals:   11/24/15 1608  Height: 5' 5.5" (1.664 m)  Weight: 205 lb (92.987 kg)   Body mass index is 33.58 kg/(m^2). Ideal Body Weight: Weight in (lb) to have BMI = 25: 152.2  GEN: WDWN, NAD, Non-toxic, A & O x 3, obese, looks well HEENT: Atraumatic, Normocephalic. Neck supple. No masses, No LAD.  Bilateral TM wnl, oropharynx normal.  PEERL,EOMI.   Ears and Nose: No external deformity. CV: RRR, No M/G/R. No JVD. No thrill. No extra heart sounds. PULM: CTA B, no wheezes, crackles, rhonchi. No retractions. No resp. distress. No accessory muscle use. EXTR: No c/c/e NEURO Normal  gait.  PSYCH: Normally interactive. Conversant. Not depressed or anxious appearing.  Calm demeanor.    Assessment and Plan: H/O cold sores - Plan: valACYclovir (VALTREX) 500 MG tablet  Screening for breast cancer - Plan: MM Digital Screening  Screening for deficiency anemia - Plan: CBC  Screening for thyroid disorder - Plan: TSH  Screening for hyperlipidemia - Plan: Lipid panel  Screening for diabetes mellitus - Plan: Comprehensive metabolic panel, Hemoglobin A1c  Hot flashes - Plan: [facility] [address]  Establish care, and screening as above She thinks she is going though menopause as she has noted hot flashes; will check an [facility] for her Referred for mammo Will plan further follow- up pending labs. Refilled her prn valtrex Plan to recheck for a CPE soon  Signed Abbe Amsterdam, MD

## 2015-11-25 ENCOUNTER — Encounter: Payer: Self-pay | Admitting: Family Medicine

## 2015-11-25 LAB — TSH: TSH: 1.64 u[IU]/mL (ref 0.35–4.50)

## 2015-11-25 LAB — LIPID PANEL
CHOL/HDL RATIO: 5
CHOLESTEROL: 283 mg/dL — AB (ref 0–200)
HDL: 53.6 mg/dL (ref >39.00)
NonHDL: 228.97
TRIGLYCERIDES: 209 mg/dL — AB (ref 0.0–149.0)
VLDL: 41.8 mg/dL — ABNORMAL HIGH (ref 0.0–40.0)

## 2015-11-25 LAB — CBC
HCT: 46.2 % — ABNORMAL HIGH (ref 36.0–46.0)
HEMOGLOBIN: 15.6 g/dL — AB (ref 12.0–15.0)
MCHC: 33.7 g/dL (ref 30.0–36.0)
MCV: 89.8 fl (ref 78.0–100.0)
PLATELETS: 289 10*3/uL (ref 150.0–400.0)
RBC: 5.14 Mil/uL — AB (ref 3.87–5.11)
RDW: 13.1 % (ref 11.5–15.5)
WBC: 8.2 10*3/uL (ref 4.0–10.5)

## 2015-11-25 LAB — COMPREHENSIVE METABOLIC PANEL
ALBUMIN: 4.7 g/dL (ref 3.5–5.2)
ALK PHOS: 81 U/L (ref 39–117)
ALT: 13 U/L (ref 0–35)
AST: 16 U/L (ref 0–37)
BUN: 9 mg/dL (ref 6–23)
CALCIUM: 9.7 mg/dL (ref 8.4–10.5)
CHLORIDE: 103 meq/L (ref 96–112)
CO2: 28 mEq/L (ref 19–32)
CREATININE: 0.76 mg/dL (ref 0.40–1.20)
GFR: 85.53 mL/min (ref >60.00)
Glucose, Bld: 96 mg/dL (ref 70–99)
Potassium: 3.9 mEq/L (ref 3.5–5.1)
Sodium: 140 mEq/L (ref 135–145)
Total Bilirubin: 0.3 mg/dL (ref 0.2–1.2)
Total Protein: 7.6 g/dL (ref 6.0–8.3)

## 2015-11-25 LAB — HEMOGLOBIN A1C: Hgb A1c MFr Bld: 5.6 % (ref 4.6–6.5)

## 2015-11-25 LAB — FOLLICLE STIMULATING HORMONE: FSH: 44.7 m[IU]/mL

## 2015-11-25 LAB — LDL CHOLESTEROL, DIRECT: LDL DIRECT: 196 mg/dL

## 2015-11-25 NOTE — Addendum Note (Signed)
Addended by: Abbe AmsterdamOPLAND, Chanler Mendonca C on: 11/25/2015 07:28 PM   Modules accepted: Orders

## 2015-12-01 ENCOUNTER — Ambulatory Visit (HOSPITAL_BASED_OUTPATIENT_CLINIC_OR_DEPARTMENT_OTHER)
Admission: RE | Admit: 2015-12-01 | Discharge: 2015-12-01 | Disposition: A | Discharge status: Home / Self Care | Payer: Managed Care, Other (non HMO) | Source: Ambulatory Visit | Attending: Family Medicine | Admitting: Family Medicine

## 2015-12-01 DIAGNOSIS — Z1239 Encounter for other screening for malignant neoplasm of breast: Secondary | ICD-10-CM | POA: Insufficient documentation

## 2015-12-01 DIAGNOSIS — Z1231 Encounter for screening mammogram for malignant neoplasm of breast: Secondary | ICD-10-CM | POA: Insufficient documentation

## 2015-12-01 DIAGNOSIS — R928 Other abnormal and inconclusive findings on diagnostic imaging of breast: Secondary | ICD-10-CM | POA: Insufficient documentation

## 2015-12-04 ENCOUNTER — Other Ambulatory Visit: Payer: Self-pay | Admitting: Family Medicine

## 2015-12-04 DIAGNOSIS — R928 Other abnormal and inconclusive findings on diagnostic imaging of breast: Secondary | ICD-10-CM

## 2015-12-10 ENCOUNTER — Other Ambulatory Visit: Payer: Self-pay | Admitting: Family Medicine

## 2015-12-10 ENCOUNTER — Ambulatory Visit
Admission: RE | Admit: 2015-12-10 | Discharge: 2015-12-10 | Disposition: A | Discharge status: Home / Self Care | Payer: Managed Care, Other (non HMO) | Source: Ambulatory Visit | Attending: Family Medicine | Admitting: Family Medicine

## 2015-12-10 DIAGNOSIS — R928 Other abnormal and inconclusive findings on diagnostic imaging of breast: Secondary | ICD-10-CM

## 2015-12-10 DIAGNOSIS — N6320 Unspecified lump in the left breast, unspecified quadrant: Secondary | ICD-10-CM

## 2015-12-15 ENCOUNTER — Other Ambulatory Visit: Payer: Self-pay | Admitting: Family Medicine

## 2015-12-15 ENCOUNTER — Ambulatory Visit
Admission: RE | Admit: 2015-12-15 | Discharge: 2015-12-15 | Disposition: A | Discharge status: Home / Self Care | Payer: Managed Care, Other (non HMO) | Source: Ambulatory Visit | Attending: Family Medicine | Admitting: Family Medicine

## 2015-12-15 DIAGNOSIS — N6320 Unspecified lump in the left breast, unspecified quadrant: Secondary | ICD-10-CM

## 2016-03-18 ENCOUNTER — Ambulatory Visit (INDEPENDENT_AMBULATORY_CARE_PROVIDER_SITE_OTHER): Payer: Managed Care, Other (non HMO) | Admitting: Medical

## 2016-03-18 ENCOUNTER — Emergency Department (HOSPITAL_BASED_OUTPATIENT_CLINIC_OR_DEPARTMENT_OTHER)
Admission: EM | Admit: 2016-03-18 | Discharge: 2016-03-18 | Disposition: A | Discharge status: Home / Self Care | Payer: Managed Care, Other (non HMO) | Attending: Emergency Medicine | Admitting: Emergency Medicine

## 2016-03-18 ENCOUNTER — Encounter (HOSPITAL_BASED_OUTPATIENT_CLINIC_OR_DEPARTMENT_OTHER): Payer: Self-pay | Admitting: Emergency Medicine

## 2016-03-18 ENCOUNTER — Telehealth: Payer: Self-pay | Admitting: Family Medicine

## 2016-03-18 ENCOUNTER — Emergency Department (HOSPITAL_BASED_OUTPATIENT_CLINIC_OR_DEPARTMENT_OTHER): Payer: Managed Care, Other (non HMO)

## 2016-03-18 VITALS — BP 133/67 | HR 71 | Temp 97.8°F | Ht 65.5 in | Wt 212.8 lb

## 2016-03-18 DIAGNOSIS — N390 Urinary tract infection, site not specified: Secondary | ICD-10-CM

## 2016-03-18 DIAGNOSIS — M546 Pain in thoracic spine: Secondary | ICD-10-CM | POA: Diagnosis not present

## 2016-03-18 DIAGNOSIS — J069 Acute upper respiratory infection, unspecified: Secondary | ICD-10-CM

## 2016-03-18 DIAGNOSIS — J209 Acute bronchitis, unspecified: Secondary | ICD-10-CM

## 2016-03-18 DIAGNOSIS — R0789 Other chest pain: Secondary | ICD-10-CM | POA: Diagnosis not present

## 2016-03-18 DIAGNOSIS — R200 Anesthesia of skin: Secondary | ICD-10-CM

## 2016-03-18 DIAGNOSIS — J45909 Unspecified asthma, uncomplicated: Secondary | ICD-10-CM | POA: Insufficient documentation

## 2016-03-18 DIAGNOSIS — Z79899 Other long term (current) drug therapy: Secondary | ICD-10-CM | POA: Diagnosis not present

## 2016-03-18 DIAGNOSIS — R0981 Nasal congestion: Secondary | ICD-10-CM | POA: Diagnosis present

## 2016-03-18 DIAGNOSIS — R062 Wheezing: Secondary | ICD-10-CM

## 2016-03-18 DIAGNOSIS — F17210 Nicotine dependence, cigarettes, uncomplicated: Secondary | ICD-10-CM | POA: Insufficient documentation

## 2016-03-18 DIAGNOSIS — R82998 Other abnormal findings in urine: Secondary | ICD-10-CM

## 2016-03-18 LAB — BASIC METABOLIC PANEL
ANION GAP: 9 (ref 5–15)
BUN: 15 mg/dL (ref 6–20)
CALCIUM: 8.8 mg/dL — AB (ref 8.9–10.3)
CO2: 28 mmol/L (ref 22–32)
Chloride: 101 mmol/L (ref 101–111)
Creatinine, Ser: 0.76 mg/dL (ref 0.44–1.00)
GFR calc Af Amer: >60 mL/min (ref >60)
GLUCOSE: 93 mg/dL (ref 65–99)
Potassium: 3.3 mmol/L — ABNORMAL LOW (ref 3.5–5.1)
SODIUM: 138 mmol/L (ref 135–145)

## 2016-03-18 LAB — URINALYSIS, ROUTINE W REFLEX MICROSCOPIC
BILIRUBIN URINE: NEGATIVE
GLUCOSE, UA: NEGATIVE mg/dL
KETONES UR: NEGATIVE mg/dL
Leukocytes, UA: NEGATIVE
NITRITE: NEGATIVE
PH: 5.5 (ref 5.0–8.0)
PROTEIN: NEGATIVE mg/dL
Specific Gravity, Urine: 1.012 (ref 1.005–1.030)

## 2016-03-18 LAB — URINE MICROSCOPIC-ADD ON: WBC, UA: NONE SEEN WBC/hpf (ref 0–5)

## 2016-03-18 LAB — CBC WITH DIFFERENTIAL/PLATELET
Basophils Absolute: 0 10*3/uL (ref 0.0–0.1)
Basophils Relative: 0 %
EOS ABS: 0.1 10*3/uL (ref 0.0–0.7)
EOS PCT: 1 %
HCT: 42.4 % (ref 36.0–46.0)
Hemoglobin: 14.2 g/dL (ref 12.0–15.0)
LYMPHS PCT: 37 %
Lymphs Abs: 3.8 10*3/uL (ref 0.7–4.0)
MCH: 30.4 pg (ref 26.0–34.0)
MCHC: 33.5 g/dL (ref 30.0–36.0)
MCV: 90.8 fL (ref 78.0–100.0)
MONO ABS: 0.1 10*3/uL (ref 0.1–1.0)
MONOS PCT: 1 %
MYELOCYTES: 1 %
Metamyelocytes Relative: 1 %
Neutro Abs: 6.3 10*3/uL (ref 1.7–7.7)
Neutrophils Relative %: 59 %
PLATELETS: 243 10*3/uL (ref 150–400)
RBC: 4.67 MIL/uL (ref 3.87–5.11)
RDW: 13.3 % (ref 11.5–15.5)
WBC: 10.3 10*3/uL (ref 4.0–10.5)

## 2016-03-18 LAB — RAPID STREP SCREEN (MED CTR MEBANE ONLY): Streptococcus, Group A Screen (Direct): NEGATIVE

## 2016-03-18 LAB — TROPONIN I

## 2016-03-18 MED ORDER — ACETAMINOPHEN 325 MG PO TABS
650.0000 mg | ORAL_TABLET | Freq: Once | ORAL | Status: AC
  Administered 2016-03-18: 650 mg via ORAL
  Filled 2016-03-18: qty 2

## 2016-03-18 MED ORDER — BENZONATATE 100 MG PO CAPS: 100.0000 mg | ORAL_CAPSULE | Freq: Three times a day (TID) | ORAL | 0 refills | Status: DC

## 2016-03-18 MED ORDER — IPRATROPIUM-ALBUTEROL 0.5-2.5 (3) MG/3ML IN SOLN
RESPIRATORY_TRACT | Status: AC
  Administered 2016-03-18: 3 mL
  Filled 2016-03-18: qty 3

## 2016-03-18 NOTE — Addendum Note (Signed)
Addended by: Lurline HareARTER, Kerney Hopfensperger E on: 03/18/2016 03:46 PM   Modules accepted: Orders

## 2016-03-18 NOTE — ED Triage Notes (Signed)
paient states that she has had some left sided chest pressure and tight and heavy to her chest with some nasal congestion. The patient reports that she went to her MD upstairs and was sent her due to the chest pressure

## 2016-03-18 NOTE — ED Provider Notes (Signed)
[facility] Provider Note   CSN: [id number] Arrival date & time: 03/18/16  1459  By signing my name below, I, Vista Mink, attest that this documentation has been prepared under the direction and in the presence of Felicie Morn NP.  Electronically Signed: Vista Mink, ED Scribe. 03/18/16. 4:39 PM.   History   Chief Complaint Chief Complaint  Patient presents with  . Chest Pain    HPI HPI Comments: [name] is a 50 y.o. female with a Hx of GERD, who presents to the Emergency Department complaining of nasal congestion, cough and left sided chest pain, onset 3 days ago. Pt was seen by her PCP here today for these complaints and also states that her chest has felt really heavy the past two days. Her PCP instructed her to come here to be evaluated. She also reports associated shortness of breath and an intermittent "tingling" sensation to bilateral upper extremities. Pt has been taking Prednisone for the past 7 days with no relief of symptoms. No nausea or vomiting  The history is provided by the patient. No language interpreter was used.    Past Medical History:  Diagnosis Date  . Asthma   . Endometriosis   . Eosinophilic esophagitis   . Feline esophagus   . GERD (gastroesophageal reflux disease)   . HSV-2 (herpes simplex virus 2) infection   . Hyperlipidemia   . Psoriasis   . Ulcerative colitis [facility])     Patient Active Problem List   Diagnosis Date Noted  . Universal ulcerative (chronic) colitis 04/13/2012  . Reflux esophagitis 04/13/2012  . Esophageal reflux 04/13/2012    Past Surgical History:  Procedure Laterality Date  . ABDOMINAL HYSTERECTOMY  11/2010  . COLONOSCOPY  04/13/2012   Procedure: COLONOSCOPY;  Surgeon: Hart Carwin, MD;  Location: [facility];  Service: Endoscopy;  Laterality: N/A;  . ESOPHAGOGASTRODUODENOSCOPY  04/13/2012   Procedure: ESOPHAGOGASTRODUODENOSCOPY (EGD);  Surgeon: Hart Carwin, MD;  Location: [facility];  Service:  Endoscopy;  Laterality: N/A;  . LAPAROSCOPY  1990   For endometriosis   . TUBAL LIGATION  1999    OB History    No data available       Home Medications    Prior to Admission medications   Medication Sig Start Date End Date Taking? Authorizing Provider  fish oil-omega-3 fatty acids 1000 MG capsule Take 1 capsule by mouth at bedtime.    Historical Provider, MD  meloxicam (MOBIC) 15 MG tablet Take 1 tablet (15 mg total) by mouth daily. Use as needed Patient not taking: Reported on 03/18/2016 12/31/14   Pearline Cables, MD  omeprazole (PRILOSEC) 40 MG capsule Take 1 capsule (40 mg total) by mouth daily. Patient not taking: Reported on 03/18/2016 03/28/12   Hart Carwin, MD  PREDNISONE, PAK, PO Take by mouth as needed.    Historical Provider, MD  PROAIR HFA 108 (90 Base) MCG/ACT inhaler 1-2 INHALATIONS EVERY 4-6 HOURS AS NEEDED FOR WHEEZING. DISPENSE SPACER AS NEEDED. 10/16/15   Historical Provider, MD  valACYclovir (VALTREX) 500 MG tablet Take 4 pills (2000 mg) once, repeat 4 pills in 12 hours as needed for cold sore Patient not taking: Reported on 03/18/2016 11/24/15   Pearline Cables, MD    Family History Family History  Problem Relation Age of Onset  . Ulcerative colitis Maternal Grandfather   . Heart disease Maternal Grandfather   . Esophageal cancer Paternal Grandfather   . Heart disease Maternal Grandmother   .  Colon cancer Neg Hx     Social History Social History  Substance Use Topics  . Smoking status: Current Every Day Smoker    Packs/day: 0.50    Types: Cigarettes  . Smokeless tobacco: Never Used  . Alcohol use No     Allergies   Sulfa antibiotics   Review of Systems Review of Systems  Cardiovascular: Positive for chest pain (left sided).  Gastrointestinal: Negative for nausea and vomiting.  All other systems reviewed and are negative.    Physical Exam Updated Vital Signs BP 153/84 (BP Location: Left Arm)   Pulse 61   Temp 98.1 F (36.7 C) (Oral)    Resp 18   Ht 5\' 5"  (1.651 m)   Wt 212 lb (96.2 kg)   SpO2 100%   BMI 35.28 kg/m   Physical Exam  Constitutional: She is oriented to person, place, and time. She appears well-developed and well-nourished. No distress.  HENT:  Head: Normocephalic and atraumatic.  Eyes: Conjunctivae are normal. Pupils are equal, round, and reactive to light.  Neck: Normal range of motion.  Cardiovascular: Normal rate, regular rhythm and normal heart sounds.   Pulmonary/Chest: Effort normal and breath sounds normal. She has no wheezes. She has no rales.  Abdominal: Soft. Bowel sounds are normal.  Musculoskeletal: Normal range of motion. She exhibits no edema.  Neurological: She is alert and oriented to person, place, and time. No cranial nerve deficit.  Skin: Skin is warm and dry. Capillary refill takes less than 2 seconds. She is not diaphoretic.  Psychiatric: She has a normal mood and affect. Judgment normal.  Nursing note and vitals reviewed.    ED Treatments / Results  DIAGNOSTIC STUDIES: Oxygen Saturation is 100% on RA, normal by my interpretation.  COORDINATION OF CARE: 4:35 PM-Will review EKG. Discussed treatment plan with pt at bedside and pt agreed to plan.   Labs (all labs ordered are listed, but only abnormal results are displayed) Labs Reviewed  BASIC METABOLIC PANEL - Abnormal; Notable for the following:       Result Value   Potassium 3.3 (*)    Calcium 8.8 (*)    All other components within normal limits  URINALYSIS, ROUTINE W REFLEX MICROSCOPIC (NOT AT [facility]) - Abnormal; Notable for the following:    Hgb urine dipstick MODERATE (*)    All other components within normal limits  URINE MICROSCOPIC-ADD ON - Abnormal; Notable for the following:    Squamous Epithelial / LPF 0-5 (*)    Bacteria, UA RARE (*)    All other components within normal limits  RAPID STREP SCREEN (NOT AT [facility])  URINE CULTURE  CULTURE, GROUP A STREP ([facility])  CBC WITH DIFFERENTIAL/PLATELET  TROPONIN I    TROPONIN I    EKG  EKG Interpretation None       Radiology Dg Chest 2 View  Result Date: 03/18/2016 CLINICAL DATA:  Left-sided chest pressure with some nasal congestion and cough 1 week. EXAM: CHEST  2 VIEW COMPARISON:  None. FINDINGS: Lungs are hypoinflated without focal consolidation or effusion. Mild elevation of the left hemidiaphragm. Cardiomediastinal silhouette is within normal. Remaining bones and soft tissues are within normal. IMPRESSION: Hypoinflation without acute cardiopulmonary disease. Electronically Signed   By: Elberta Fortisaniel  Boyle M.D.   On: 03/18/2016 15:36    Procedures Procedures (including critical care time)  Medications Ordered in ED Medications  ipratropium-albuterol (DUONEB) 0.5-2.5 (3) MG/3ML nebulizer solution (3 mLs  Given 03/18/16 1520)  acetaminophen (TYLENOL) tablet 650 mg (650 mg  Oral Given 03/18/16 1846)     Initial Impression / Assessment and Plan / ED Course  I have reviewed the triage vital signs and the nursing notes.  Pertinent labs & imaging results that were available during my care of the patient were reviewed by me and considered in my medical decision making (see chart for details).  Clinical Course  Patient with URI symptoms and persistent cough. Patient has been seen by her PCP, is currently completing round of antibiotic and prednisone.  Patient sent from the office to the ED today d/t patient reporting chest pain/pressure.  12 lead without ischemic changes. Negative delta troponin. No pneumonia or consolidation on CXR. No UTI symptoms.  Care instructions provided and return precautions discussed    Final Clinical Impressions(s) / ED Diagnoses   Final diagnoses:  Other chest pain  Infection, upper respiratory    New Prescriptions Discharge Medication List as of 03/18/2016  8:48 PM    START taking these medications   Details  benzonatate (TESSALON) 100 MG capsule Take 1 capsule (100 mg total) by mouth every 8 (eight) hours.,  Starting Thu 03/18/2016, Print      I personally performed the services described in this documentation, which was scribed in my presence. The recorded information has been reviewed and is accurate.     Felicie Morn, NP 03/19/16 0115    Melene Plan, DO 03/19/16 2346

## 2016-03-18 NOTE — Addendum Note (Signed)
Addended by: Lurline HareARTER, Tran Randle E on: 03/18/2016 04:34 PM   Modules accepted: Orders

## 2016-03-18 NOTE — Progress Notes (Signed)
Pre visit review using our clinic tool,if applicable. No additional management support is needed unless otherwise documented below in the visit note.  

## 2016-03-18 NOTE — Patient Instructions (Addendum)
A lot of your presentation appear to be bronchitis vs pneumonia with asthma flare.   But your slight chest pressure sensation, back pain and left hand numbness causes concern for possible other underlying condition. You also have some cardiac risk factors and your bp is elevated today.  I talked with your pcp Dr. Dallas Schimkeopeland and she thinks further work may beneficial in ED.  I talked with ED physician and notified them of your presentation.  Follow up determined by ED or as needed

## 2016-03-18 NOTE — Telephone Encounter (Signed)
Patient called complaining of allergies, but then stated that she was having a hard time breathing and tingling in her arm. I sent her to team health but scheduled an appointment with Ramon DredgeEdward at 1:15pm today. It is not an acute slot, is that okay?

## 2016-03-18 NOTE — Telephone Encounter (Signed)
Patient Name: [name] DOB: 02/04/1966 Initial Comment Caller states having hard time catching her breath, bad cough, tingling in left arm and hand Nurse Assessment Nurse: Leveda AnnaHensel, RN, Aeriel Date/Time (Eastern Time): 03/18/2016 11:35:03 AM Confirm and document reason for call. If symptomatic, describe symptoms. You must click the next button to save text entered. ---Caller states having hard time catching her breath, bad cough, tingling in left arm and hand. Has the patient traveled out of the country within the last 30 days? ---No Does the patient have any new or worsening symptoms? ---Yes Will a triage be completed? ---Yes Related visit to physician within the last 2 weeks? ---Yes Does the PT have any chronic conditions? (i.e. diabetes, asthma, etc.) ---Yes List chronic conditions. ---seasonal asthma, has an inhaler and has been using it Is the patient pregnant or possibly pregnant? (Ask all females between the ages of 1312-55) ---No Is this a behavioral health or substance abuse call? ---No Guidelines Guideline Title Affirmed Question Affirmed Notes Sinus Infection on Antibiotic Follow-up Call [1] Difficulty breathing AND [2] not from stuffy nose (e.g., not relieved by cleaning out the nose) Neurologic Deficit Headache (and neurologic deficit) Final Disposition User Go to ED Now Hensel, RN, Aeriel Comments Nures did advise caller to seek care at closest emergency room caller [facility] Disagree/Comply: Comply Disagree/Comply: Comply

## 2016-03-18 NOTE — Progress Notes (Signed)
Subjective:    Patient ID: [name], female    DOB: 07/02/1965, 50 y.o.   MRN: [id number]  HPI 3 weeks ago had some sinus pressure and wheezing ago. UC  gave amoxicillin, prednisone and mucinex. The prednisone was 10 days taper dose  Pt states felt better but then last Thursday started to get recurrent sinus pressure and started to wheeze. Pt states she finished residual/left over amoxicillin from prior treatment. Pt has been taking some left over prednisone tab bid. She thinks they are 10 mg tabs. She has been taking prednisone for one week.  Pt has been feeling some possible fever and chills for 3-4 days.  Pt in states she has some dry cough. Pt does have history of asthma. Pt does smoke. Less than a pack a day. Pt has proair inhaler.  Pt has never been hospitalized due to asthma.   Last two days faint chest pressure diffuse as if unable to take full breath. Some back pain upper back. Some hand numbness. Foream and hand discomfort past 2 days.,  Hyperlipidemia, smoker, possible post menopause?, overweight. Neg fh.   Review of Systems  Constitutional: Positive for chills and fever.  HENT: Negative for congestion.   Respiratory: Positive for cough, chest tightness and wheezing. Negative for shortness of breath.        Pressure sensation.  Cardiovascular: Negative for chest pain and palpitations.  Gastrointestinal: Negative for abdominal pain, diarrhea and nausea.  Genitourinary: Negative for dysuria.  Musculoskeletal: Positive for back pain. Negative for arthralgias, myalgias, neck pain and neck stiffness.       Hand discomfort and some numbness.  Skin: Negative for rash.  Neurological: Negative for dizziness, seizures and weakness.  Hematological: Negative for adenopathy. Does not bruise/bleed easily.  Psychiatric/Behavioral: Negative for behavioral problems and confusion.    Past Medical History:  Diagnosis Date  . Asthma   . Endometriosis   . Eosinophilic  esophagitis   . Feline esophagus   . GERD (gastroesophageal reflux disease)   . HSV-2 (herpes simplex virus 2) infection   . Hyperlipidemia   . Psoriasis   . Ulcerative colitis [facility](HCC)      Social History   Social History  . Marital status: Divorced    Spouse name: N/A  . Number of children: 2  . Years of education: N/A   Occupational History  . SENIOR ANALYST [employer]   Social History Main Topics  . Smoking status: Current Every Day Smoker    Packs/day: 0.50    Types: Cigarettes  . Smokeless tobacco: Never Used  . Alcohol use No  . Drug use: No  . Sexual activity: Not on file   Other Topics Concern  . Not on file   Social History Narrative   Daily caffeine     Past Surgical History:  Procedure Laterality Date  . ABDOMINAL HYSTERECTOMY  11/2010  . COLONOSCOPY  04/13/2012   Procedure: COLONOSCOPY;  Surgeon: Hart Carwinora M Brodie, MD;  Location: [facility];  Service: Endoscopy;  Laterality: N/A;  . ESOPHAGOGASTRODUODENOSCOPY  04/13/2012   Procedure: ESOPHAGOGASTRODUODENOSCOPY (EGD);  Surgeon: Hart Carwinora M Brodie, MD;  [facility];  Service: Endoscopy;  Laterality: N/A;  . LAPAROSCOPY  1990   For endometriosis   . TUBAL LIGATION  1999    Family History  Problem Relation Age of Onset  . Ulcerative colitis Maternal Grandfather   . Heart disease Maternal Grandfather   . Esophageal cancer Paternal Grandfather   . Colon cancer Neg Hx   .  Heart disease Maternal Grandmother     Allergies  Allergen Reactions  . Sulfa Antibiotics Anaphylaxis    Current Outpatient Prescriptions on File Prior to Visit  Medication Sig Dispense Refill  . PREDNISONE, PAK, PO Take by mouth as needed.    Marland Kitchen PROAIR HFA 108 (90 Base) MCG/ACT inhaler 1-2 INHALATIONS EVERY 4-6 HOURS AS NEEDED FOR WHEEZING. DISPENSE SPACER AS NEEDED.  0  . fish oil-omega-3 fatty acids 1000 MG capsule Take 1 capsule by mouth at bedtime.    . meloxicam (MOBIC) 15 MG tablet Take 1 tablet (15 mg total) by  mouth daily. Use as needed (Patient not taking: Reported on 03/18/2016) 30 tablet 0  . omeprazole (PRILOSEC) 40 MG capsule Take 1 capsule (40 mg total) by mouth daily. (Patient not taking: Reported on 03/18/2016) 30 capsule 3  . valACYclovir (VALTREX) 500 MG tablet Take 4 pills (2000 mg) once, repeat 4 pills in 12 hours as needed for cold sore (Patient not taking: Reported on 03/18/2016) 32 tablet 1   No current facility-administered medications on file prior to visit.     BP 133/67   Pulse 71   Temp 97.8 F (36.6 C) (Oral)   Ht 5' 5.5" (1.664 m)   Wt 212 lb 12.8 oz (96.5 kg)   SpO2 99%   BMI 34.87 kg/m       Objective:   Physical Exam  General  Mental Status - Alert. General Appearance - Well groomed. Not in acute distress.  Skin Rashes- No Rashes.  HEENT Head- Normal. Ear Auditory Canal - Left- Normal. Right - Normal.Tympanic Membrane- Left- Normal. Right- Normal. Eye Sclera/Conjunctiva- Left- Normal. Right- Normal. Nose & Sinuses Nasal Mucosa- Left-  Boggy and Congested. Right-  Boggy and  Congested.Bilateral faint  maxillary but no  frontal sinus pressure. Mouth & Throat Lips: Upper Lip- Normal: no dryness, cracking, pallor, cyanosis, or vesicular eruption. Lower Lip-Normal: no dryness, cracking, pallor, cyanosis or vesicular eruption. Buccal Mucosa- Bilateral- No Aphthous ulcers. Oropharynx- No Discharge or Erythema. Tonsils: Characteristics- Bilateral- No Erythema or Congestion. Size/Enlargement- Bilateral- No enlargement. Discharge- bilateral-None.  Neck Neck- Supple. No Masses.   Chest and Lung Exam Auscultation: Breath Sounds:-even, mild shallow with course breath sounds.  Cardiovascular Auscultation:Rythm- Regular, rate and rhythm. Murmurs & Other Heart Sounds:Ausculatation of the heart reveal- No Murmurs.  Lymphatic Head & Neck General Head & Neck Lymphatics: Bilateral: Description- No Localized lymphadenopathy.       Assessment & Plan:  A lot of  your presentation appear to be bronchitis vs pneumonia with asthma flare.   But your slight chest pressure sensation, back pain and left hand numbness causes concern for possible other underlying condition. You also have some cardiac risk factors and your bp is elevated today.  I talked with your pcp Dr. Dallas Schimke and she thinks further work may beneficial in ED.  I talked with ED physician and notified them of your presentation.  Follow up determined by ED or as needed  Shivam Mestas, Ramon Dredge, PA-C

## 2016-03-19 LAB — URINE CULTURE

## 2016-03-21 LAB — CULTURE, GROUP A STREP (THRC)

## 2016-05-11 ENCOUNTER — Other Ambulatory Visit: Payer: Self-pay | Admitting: Family Medicine

## 2016-05-11 DIAGNOSIS — N6320 Unspecified lump in the left breast, unspecified quadrant: Secondary | ICD-10-CM

## 2016-06-11 ENCOUNTER — Ambulatory Visit
Admission: RE | Admit: 2016-06-11 | Discharge: 2016-06-11 | Disposition: A | Discharge status: Home / Self Care | Payer: Managed Care, Other (non HMO) | Source: Ambulatory Visit | Attending: Family Medicine | Admitting: Family Medicine

## 2016-06-11 ENCOUNTER — Other Ambulatory Visit: Payer: Self-pay | Admitting: Family Medicine

## 2016-06-11 DIAGNOSIS — N6320 Unspecified lump in the left breast, unspecified quadrant: Secondary | ICD-10-CM

## 2016-06-17 ENCOUNTER — Other Ambulatory Visit: Payer: Managed Care, Other (non HMO)

## 2017-01-05 ENCOUNTER — Other Ambulatory Visit: Payer: Self-pay | Admitting: Family Medicine

## 2017-01-05 DIAGNOSIS — N63 Unspecified lump in unspecified breast: Secondary | ICD-10-CM

## 2017-01-18 ENCOUNTER — Ambulatory Visit
Admission: RE | Admit: 2017-01-18 | Discharge: 2017-01-18 | Disposition: A | Discharge status: Home / Self Care | Payer: Managed Care, Other (non HMO) | Source: Ambulatory Visit | Attending: Family Medicine | Admitting: Family Medicine

## 2017-01-18 DIAGNOSIS — N63 Unspecified lump in unspecified breast: Secondary | ICD-10-CM
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
## 2017-02-03 ENCOUNTER — Ambulatory Visit (INDEPENDENT_AMBULATORY_CARE_PROVIDER_SITE_OTHER): Payer: 59 | Admitting: Family Medicine

## 2017-02-03 VITALS — BP 129/87 | HR 64 | Temp 98.0°F | Ht 65.5 in | Wt 205.4 lb

## 2017-02-03 DIAGNOSIS — Z13220 Encounter for screening for lipoid disorders: Secondary | ICD-10-CM | POA: Diagnosis not present

## 2017-02-03 DIAGNOSIS — Z6379 Other stressful life events affecting family and household: Secondary | ICD-10-CM | POA: Diagnosis not present

## 2017-02-03 DIAGNOSIS — Z131 Encounter for screening for diabetes mellitus: Secondary | ICD-10-CM

## 2017-02-03 DIAGNOSIS — M25511 Pain in right shoulder: Secondary | ICD-10-CM | POA: Diagnosis not present

## 2017-02-03 DIAGNOSIS — Z130 Encounter for screening for diseases of the blood and blood-forming organs and certain disorders involving the immune mechanism: Secondary | ICD-10-CM

## 2017-02-03 DIAGNOSIS — M791 Myalgia, unspecified site: Secondary | ICD-10-CM

## 2017-02-03 LAB — LIPID PANEL
CHOL/HDL RATIO: 4
Cholesterol: 203 mg/dL — ABNORMAL HIGH (ref 0–200)
HDL: 52.8 mg/dL (ref >39.00)
LDL Cholesterol: 132 mg/dL — ABNORMAL HIGH (ref 0–99)
NONHDL: 150.67
TRIGLYCERIDES: 94 mg/dL (ref 0.0–149.0)
VLDL: 18.8 mg/dL (ref 0.0–40.0)

## 2017-02-03 LAB — CBC
HEMATOCRIT: 46.4 % — AB (ref 36.0–46.0)
HEMOGLOBIN: 15.3 g/dL — AB (ref 12.0–15.0)
MCHC: 32.9 g/dL (ref 30.0–36.0)
MCV: 93.1 fl (ref 78.0–100.0)
Platelets: 250 10*3/uL (ref 150.0–400.0)
RBC: 4.99 Mil/uL (ref 3.87–5.11)
RDW: 13.2 % (ref 11.5–15.5)
WBC: 7.1 10*3/uL (ref 4.0–10.5)

## 2017-02-03 LAB — COMPREHENSIVE METABOLIC PANEL
ALT: 11 U/L (ref 0–35)
AST: 12 U/L (ref 0–37)
Albumin: 4 g/dL (ref 3.5–5.2)
Alkaline Phosphatase: 68 U/L (ref 39–117)
BUN: 12 mg/dL (ref 6–23)
CO2: 31 meq/L (ref 19–32)
Calcium: 9.2 mg/dL (ref 8.4–10.5)
Chloride: 104 mEq/L (ref 96–112)
Creatinine, Ser: 0.76 mg/dL (ref 0.40–1.20)
GFR: 85.12 mL/min (ref >60.00)
GLUCOSE: 94 mg/dL (ref 70–99)
POTASSIUM: 4.3 meq/L (ref 3.5–5.1)
Sodium: 141 mEq/L (ref 135–145)
Total Bilirubin: 0.4 mg/dL (ref 0.2–1.2)
Total Protein: 6.3 g/dL (ref 6.0–8.3)

## 2017-02-03 LAB — SEDIMENTATION RATE: Sed Rate: 4 mm/hr (ref 0–30)

## 2017-02-03 LAB — HEMOGLOBIN A1C: HEMOGLOBIN A1C: 5.7 % (ref 4.6–6.5)

## 2017-02-03 LAB — TSH: TSH: 1.57 u[IU]/mL (ref 0.35–4.50)

## 2017-02-03 LAB — C-REACTIVE PROTEIN: CRP: 0.6 mg/dL (ref 0.5–20.0)

## 2017-02-03 LAB — CK: Total CK: 58 U/L (ref 7–177)
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
## 2017-03-10 ENCOUNTER — Encounter: Payer: Self-pay | Admitting: Gastroenterology

## 2017-08-19 ENCOUNTER — Telehealth: Payer: Self-pay | Admitting: *Deleted

## 2017-08-19 NOTE — Telephone Encounter (Signed)
[facility] 09/09/17 scheduled Sx for Spine: TDR C5-6 w/general anesthesia; forwarded to provider w/last OV note/SLS 03/01

## 2017-08-28 NOTE — Progress Notes (Addendum)
[facility] [address], [address] [place], [place] [address] [phone]  Date:  08/29/2017   Name:  [name]   DOB:  December 21, 1965   MRN:  [id number]  PCP:  Pearline Cables, MD    Chief Complaint: Pre-op Exam (Pt here for Pre-Operative visit. )   History of Present Illness:  [name] is a 51 y.o. very pleasant female patient who presents with the following:  Pre-operative visit today  She plans to have a cervical spine disectomy later on this month per Dr. Shon Baton - C5/6 She has been having right hand numbness.  Had an MRI which showed a bulging disc  Planned date of operation is 3/22  Last EKG 2017- will repeat today She was in the ER back on 9/17 with CP- however this was determined to be non cardiac in origin  Flu: she declines to have this today Tetanus: she has "no idea"  Will boost for her today   She has not noted any CP or SOB She does smoke- tried to quit cold [place] 2 weeks ago- she had a lot of trouble with mood swings, she is cutting back to 2-3 a day- she was on 1.5 ppd so this is a big reduction!  She is just smoking at night when she gets home from work  Discussed chantix- she does not really want to take this right now but might consider after her operation is complete   Wt Readings from Last 3 Encounters:  08/29/17 210 lb (95.3 kg)  02/03/17 205 lb 6.4 oz (93.2 kg)  03/18/16 212 lb (96.2 kg)    Patient Active Problem List   Diagnosis Date Noted  . 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 Reflux esophagitis 04/13/2012  . Esophageal reflux 04/13/2012    Past Medical History:  Diagnosis Date  . Asthma   . Endometriosis   . Eosinophilic esophagitis   . Feline esophagus   . GERD (gastroesophageal reflux disease)   . HSV-2 (herpes simplex virus 2) infection   . Hyperlipidemia   . Psoriasis   . Ulcerative colitis [facility])     Past Surgical History:  Procedure  Laterality Date  . ABDOMINAL HYSTERECTOMY  11/2010  . COLONOSCOPY  04/13/2012   Procedure: COLONOSCOPY;  Surgeon: Hart Carwin, MD;  Location: [facility];  Service: Endoscopy;  Laterality: N/A;  . ESOPHAGOGASTRODUODENOSCOPY  04/13/2012   Procedure: ESOPHAGOGASTRODUODENOSCOPY (EGD);  Surgeon: Hart Carwin, MD;  [facility];  Service: Endoscopy;  Laterality: N/A;  . LAPAROSCOPY  1990   For endometriosis   . TUBAL LIGATION  1999    Social History   Tobacco Use  . Smoking status: Current Every Day Smoker    Packs/day: 0.50    Types: Cigarettes  . Smokeless tobacco: Never Used  Substance Use Topics  . Alcohol use: No  . Drug use: No    Family History  Problem Relation Age of Onset  . Ulcerative colitis Maternal Grandfather   . Heart disease Maternal Grandfather   . Esophageal cancer Paternal Grandfather   . Heart disease Maternal Grandmother   . Colon cancer Neg Hx     Allergies  Allergen Reactions  . Sulfa Antibiotics Anaphylaxis    Medication list has been reviewed and updated.  Current Outpatient Medications on File Prior to Visit  Medication Sig Dispense Refill  . fish oil-omega-3 fatty acids 1000 MG capsule Take 1 capsule by  mouth at bedtime.    Marland Kitchen PREDNISONE, PAK, PO Take by mouth as needed.    Marland Kitchen PROAIR HFA 108 (90 Base) MCG/ACT inhaler 1-2 INHALATIONS EVERY 4-6 HOURS AS NEEDED FOR WHEEZING. DISPENSE SPACER AS NEEDED.  0  . valACYclovir (VALTREX) 500 MG tablet Take 4 pills (2000 mg) once, repeat 4 pills in 12 hours as needed for cold sore 32 tablet 1   No current facility-administered medications on file prior to visit.     Review of Systems:  As per HPI- otherwise negative. No fever or chills Feeling well Her only concern really is her weight    Physical Examination: Vitals:   08/29/17 0924  BP: 128/87  Pulse: (!) 58  Temp: 98.4 F (36.9 C)  SpO2: 97%   Vitals:   08/29/17 0924  Weight: 210 lb (95.3 kg)  Height: 5\' 5"  (1.651 m)    Body mass index is 34.95 kg/m. Ideal Body Weight: Weight in (lb) to have BMI = 25: 149.9  GEN: WDWN, NAD, Non-toxic, A & O x 3, obese, otherwise looks well  HEENT: Atraumatic, Normocephalic. Neck supple. No masses, No LAD.  Bilateral TM wnl, oropharynx normal.  PEERL,EOMI.   Ears and Nose: No external deformity. CV: RRR, No M/G/R. No JVD. No thrill. No extra heart sounds. PULM: CTA B, no wheezes, crackles, rhonchi. No retractions. No resp. distress. No accessory muscle use. ABD: S, NT, ND, +BS. No rebound. No HSM. EXTR: No c/c/e NEURO Normal gait.  PSYCH: Normally interactive. Conversant. Not depressed or anxious appearing.  Calm demeanor.   EKG: SR.  Compared with tracing from2017 she now has downgoing T waves in V2/3/4 Assessment and Plan: Pre-operative clearance - Plan: EKG 12-Lead, Exercise Tolerance Test, CBC, Comprehensive metabolic panel, CANCELED: Comprehensive metabolic panel, CANCELED: CBC  Psoriasis - Plan: halobetasol (ULTRAVATE) 0.05 % cream  Immunization due - Plan: Tdap vaccine greater than or equal to 7yo IM  T wave inversion in EKG - Plan: Exercise Tolerance Test  Pre-operative visit today Await her labs EKG changes, her exercise tolerance is not great.  Will obtain an ETT for her prior to clearance  Received her labs - letter to pt, normal labs   Results for orders placed or performed in visit on 08/29/17  CBC  Result Value Ref Range   WBC 8.5 4.0 - 10.5 K/uL   RBC 4.79 3.87 - 5.11 Mil/uL   Platelets 265.0 150.0 - 400.0 K/uL   Hemoglobin 14.7 12.0 - 15.0 g/dL   HCT 57.8 46.9 - 62.9 %   MCV 91.3 78.0 - 100.0 fl   MCHC 33.7 30.0 - 36.0 g/dL   RDW 52.8 41.3 - 24.4 %  Comprehensive metabolic panel  Result Value Ref Range   Sodium 140 135 - 145 mEq/L   Potassium 3.8 3.5 - 5.1 mEq/L   Chloride 103 96 - 112 mEq/L   CO2 31 19 - 32 mEq/L   Glucose, Bld 76 70 - 99 mg/dL   BUN 8 6 - 23 mg/dL   Creatinine, Ser 0.10 0.40 - 1.20 mg/dL   Total Bilirubin 0.4 0.2  - 1.2 mg/dL   Alkaline Phosphatase 72 39 - 117 U/L   AST 13 0 - 37 U/L   ALT 15 0 - 35 U/L   Total Protein 6.9 6.0 - 8.3 g/dL   Albumin 4.3 3.5 - 5.2 g/dL   Calcium 9.8 8.4 - 27.2 mg/dL   GFR 53.66 >44.03 mL/min     Signed Abbe Amsterdam, MD  Called her with her ETT results 3/13:    Blood pressure demonstrated a hypertensive response to exercise.  There was no ST segment deviation noted during stress.  7 minutes exercise time, good  No electrocardiographic evidence of ischemia. Overall low risk study.  Continue to treat hypertension.   Pt has normal BP generally  BP Readings from Last 3 Encounters:  08/29/17 128/87  02/03/17 129/87  03/18/16 120/85   Will send clearance to Dr. Shon [facility]atonBrooks [facility]

## 2017-08-29 ENCOUNTER — Ambulatory Visit (INDEPENDENT_AMBULATORY_CARE_PROVIDER_SITE_OTHER): Payer: 59 | Admitting: Family Medicine

## 2017-08-29 ENCOUNTER — Encounter: Payer: Self-pay | Admitting: Family Medicine

## 2017-08-29 VITALS — BP 128/87 | HR 58 | Temp 98.4°F | Ht 65.0 in | Wt 210.0 lb

## 2017-08-29 DIAGNOSIS — R9431 Abnormal electrocardiogram [ECG] [EKG]: Secondary | ICD-10-CM | POA: Diagnosis not present

## 2017-08-29 DIAGNOSIS — Z23 Encounter for immunization: Secondary | ICD-10-CM | POA: Diagnosis not present

## 2017-08-29 DIAGNOSIS — L409 Psoriasis, unspecified: Secondary | ICD-10-CM | POA: Diagnosis not present

## 2017-08-29 DIAGNOSIS — Z01818 Encounter for other preprocedural examination: Secondary | ICD-10-CM | POA: Diagnosis not present

## 2017-08-29 LAB — COMPREHENSIVE METABOLIC PANEL
ALBUMIN: 4.3 g/dL (ref 3.5–5.2)
ALK PHOS: 72 U/L (ref 39–117)
ALT: 15 U/L (ref 0–35)
AST: 13 U/L (ref 0–37)
BUN: 8 mg/dL (ref 6–23)
CALCIUM: 9.8 mg/dL (ref 8.4–10.5)
CO2: 31 mEq/L (ref 19–32)
Chloride: 103 mEq/L (ref 96–112)
Creatinine, Ser: 0.75 mg/dL (ref 0.40–1.20)
GFR: 86.24 mL/min (ref >60.00)
Glucose, Bld: 76 mg/dL (ref 70–99)
Potassium: 3.8 mEq/L (ref 3.5–5.1)
SODIUM: 140 meq/L (ref 135–145)
TOTAL PROTEIN: 6.9 g/dL (ref 6.0–8.3)
Total Bilirubin: 0.4 mg/dL (ref 0.2–1.2)

## 2017-08-29 LAB — CBC
HCT: 43.7 % (ref 36.0–46.0)
HEMOGLOBIN: 14.7 g/dL (ref 12.0–15.0)
MCHC: 33.7 g/dL (ref 30.0–36.0)
MCV: 91.3 fl (ref 78.0–100.0)
PLATELETS: 265 10*3/uL (ref 150.0–400.0)
RBC: 4.79 Mil/uL (ref 3.87–5.11)
RDW: 13 % (ref 11.5–15.5)
WBC: 8.5 10*3/uL (ref 4.0–10.5)

## 2017-08-29 MED ORDER — HALOBETASOL PROPIONATE 0.05 % EX CREA: TOPICAL_CREAM | Freq: Two times a day (BID) | CUTANEOUS | 2 refills | Status: DC

## 2017-08-29 NOTE — Patient Instructions (Addendum)
Best of luck with your operation- I hope that you have good results!   Also best of luck with stopping smoking- let me know if you need an rx for Chantix after your operation You got your tetanus shot today, and your pre-operative labs I will arrange for a treadmill test to ensure that your heart is ok prior to your operation  Will be in touch with your results asap

## 2017-08-31 ENCOUNTER — Ambulatory Visit (INDEPENDENT_AMBULATORY_CARE_PROVIDER_SITE_OTHER): Payer: 59

## 2017-08-31 DIAGNOSIS — Z01818 Encounter for other preprocedural examination: Secondary | ICD-10-CM

## 2017-08-31 DIAGNOSIS — R9431 Abnormal electrocardiogram [ECG] [EKG]: Secondary | ICD-10-CM

## 2017-08-31 LAB — EXERCISE TOLERANCE TEST
CHL CUP MPHR: 168 {beats}/min
CSEPEDS: 0 s
CSEPHR: 89 %
Estimated workload: 8.5 METS
Exercise duration (min): 7 min
Peak HR: 150 {beats}/min
RPE: 16
Rest HR: 59 {beats}/min

## 2017-10-26 ENCOUNTER — Encounter (HOSPITAL_COMMUNITY): Payer: Self-pay

## 2017-10-26 ENCOUNTER — Ambulatory Visit (HOSPITAL_COMMUNITY): Payer: 59 | Attending: Orthopedic Surgery

## 2017-10-26 DIAGNOSIS — M6281 Muscle weakness (generalized): Secondary | ICD-10-CM | POA: Diagnosis present

## 2017-10-26 DIAGNOSIS — R29898 Other symptoms and signs involving the musculoskeletal system: Secondary | ICD-10-CM

## 2017-10-26 DIAGNOSIS — M542 Cervicalgia: Secondary | ICD-10-CM | POA: Diagnosis present

## 2017-10-26 NOTE — Therapy (Signed)
[facility] [address] [place], [place], [address] Phone: [phone]   Fax:  [phone]  Physical Therapy Evaluation  Patient Details  Name: [name] MRN: [id number] Date of Birth: Mar 05, 1966 Referring Provider: Venita Lick, MD   Encounter Date: 10/26/2017  PT End of Session - 10/26/17 1553    Visit Number  1    Number of Visits  9    Date for PT Re-Evaluation  11/23/17    Authorization Type  [employer] NAP - 100 visits per calendar year and only 4 modalities per day    Authorization Time Period  10/26/17 to 11/23/17    Authorization - Visit Number  1    Authorization - Number of Visits  100    PT Start Time  1503    PT Stop Time  1546    PT Time Calculation (min)  43 min    Activity Tolerance  Patient tolerated treatment well    Behavior During Therapy  [facility] for tasks assessed/performed       Past Medical History:  Diagnosis Date  . Asthma   . Endometriosis   . Eosinophilic esophagitis   . Feline esophagus   . GERD (gastroesophageal reflux disease)   . HSV-2 (herpes simplex virus 2) infection   . Hyperlipidemia   . Psoriasis   . Ulcerative colitis [facility])     Past Surgical History:  Procedure Laterality Date  . ABDOMINAL HYSTERECTOMY  11/2010  . COLONOSCOPY  04/13/2012   Procedure: COLONOSCOPY;  Surgeon: Hart Carwin, MD;  Location: [facility];  Service: Endoscopy;  Laterality: N/A;  . ESOPHAGOGASTRODUODENOSCOPY  04/13/2012   Procedure: ESOPHAGOGASTRODUODENOSCOPY (EGD);  Surgeon: Hart Carwin, MD;  Location: [facility];  Service: Endoscopy;  Laterality: N/A;  . LAPAROSCOPY  1990   For endometriosis   . TUBAL LIGATION  1999    There were no vitals filed for this visit.   Subjective Assessment - 10/26/17 1506    Subjective  Pt reports having cervical surgery 6.5 weeks ago. She had full disc replacement of C5-6 by Dr. Venita Lick. She states taht prior to her surgery, she was having issues with lots of pain,  numbness in her R arm, and difficulty using her R arm. She is R handed. She reports that she has not had any issues with her numbness since the surgery. She reports she's still on restrictions (no swimming, mopping, mowing, vacuuming, etc.). She is still on these restrictions until 11/18/17 when she sees Dr. Shon Baton again. She was working full-time at [employer] as a Librarian, academic; she is out until Dr. Shon Baton clears her to return. She is not really having any pain anymore, just minor discomfort.    Limitations  House hold activities    How long can you sit comfortably?  no issues    How long can you stand comfortably?  no issues    How long can you walk comfortably?  no issues    Patient Stated Goals  strengthen neck to be able to move it as much as possible, get back to work    Currently in Pain?  No/denies         [facility] PT Assessment - 10/26/17 0001      Assessment   Medical Diagnosis  encounter for other specified surgical aftercare (post-op TDA C5-6)    Referring Provider  Venita Lick, MD    Onset Date/Surgical Date  09/09/17    Next MD Visit  11/18/17  Prior Therapy  none for current issue; some PT years ago      Precautions   Precautions  Cervical      Balance Screen   Has the patient fallen in the past 6 months  No    Has the patient had a decrease in activity level because of a fear of falling?   No    Is the patient reluctant to leave their home because of a fear of falling?   No      Prior Function   Level of Independence  Independent    Vocation  Full time employment currently out of work until MD clears return    [employer] job (computer, phone, typing, etc.)    Leisure  ride motorcycle, boating, outside activities      Observation/Other Assessments   Focus on Therapeutic Outcomes (FOTO)   59% limitation      Functional Tests   Functional tests  Other      Other:   Other/ Comments  deep neck flexor endurance test: 41 sec, recreated pain       Posture/Postural Control   Posture/Postural Control  Postural limitations    Postural Limitations  Rounded Shoulders;Forward head;Increased thoracic kyphosis      ROM / Strength   AROM / PROM / Strength  AROM;Strength      AROM   AROM Assessment Site  Cervical    Cervical Flexion  39    Cervical Extension  33    Cervical - Right Side Bend  16    Cervical - Left Side Bend  22    Cervical - Right Rotation  60    Cervical - Left Rotation  45      Strength   Strength Assessment Site  Shoulder;Elbow;Wrist;Hand;Cervical    Right Shoulder Flexion  4/5    Right Shoulder ABduction  4/5    Right Shoulder Internal Rotation  4/5    Right Shoulder External Rotation  4/5    Left Shoulder Flexion  4/5    Left Shoulder ABduction  4/5    Left Shoulder Internal Rotation  5/5    Left Shoulder External Rotation  4+/5    Right Elbow Flexion  4+/5    Right Elbow Extension  4+/5    Left Elbow Flexion  5/5    Left Elbow Extension  5/5    Right Wrist Flexion  5/5    Right Wrist Extension  5/5    Left Wrist Flexion  5/5    Left Wrist Extension  5/5    Right Hand Gross Grasp  Functional    Right Hand Grip (lbs)  50    Left Hand Gross Grasp  Functional    Left Hand Grip (lbs)  54      Palpation   Spinal mobility  thoracic spine hypomobile, recreated cervical pain intermittently at different segments; cervical spine tendener to light central PAs    Palpation comment  mod-max increases in soft tissue restricitons thorughout bil upper traps, periscapular musculature, thoracic and cervical paraspinals, suboccipitals, SCM            Objective measurements completed on examination: See above findings.        [facility] Adult PT Treatment/Exercise - 10/26/17 0001      Exercises   Exercises  Neck      Neck Exercises: Supine   Neck Retraction  10 reps;5 secs           PT Education -  10/26/17 1552    Education provided  Yes    Education Details  exam findings, POC, HEP    Person(s)  Educated  Patient    Methods  Explanation;Demonstration;Handout    Comprehension  Verbalized understanding;Returned demonstration       PT Short Term Goals - 10/26/17 1603      PT SHORT TERM GOAL #1   Title  Pt will be independent wtih HEP and perform consistently in order to decrease pain and improve ROM.    Time  2    Period  Weeks    Status  New    Target Date  11/09/17      PT SHORT TERM GOAL #2   Title  Pt will have improved cervical AROM throughout by 5 deg in order to maximize her function at home and her ability to drive.    Time  2    Period  Weeks    Status  New      PT SHORT TERM GOAL #3   Title  Pt will score 14/50 or < on the NDI to demo decreased self-perceived disability due to her neck pain.    Time  2    Period  Weeks    Status  New        PT Long Term Goals - 10/26/17 1604      PT LONG TERM GOAL #1   Title  Pt will have improved cervical AROM throughout by 10 deg or > in order to further maximize driving and allow her to perform functional tasks at home with greater ease.    Time  4    Period  Weeks    Status  New    Target Date  11/23/17      PT LONG TERM GOAL #2   Title  Pt will have 4/5 cervical MMT throughout to decrease pain and improve overall function.       PT LONG TERM GOAL #3   Title  Pt will be able to perform deep neck flexor endurance test for 60 seconds with proper form and without exacerbations in pain to demo improved overall cervical strength and decrease her pain with functional tasks.    Time  4    Period  Weeks    Status  New      PT LONG TERM GOAL #4   Title  Pt will have decreased soft tissue restrictions throughout cervical, thoracic, and periscapular musculature to min to mod in order to allow for improved ROM and decreased pain.    Time  4    Period  Weeks    Status  New             Plan - 10/26/17 1558    Clinical Impression Statement  Pt is pleasant 52YO F who presents to OPPT s/p C5-6 total disc arthroplasty  on 09/09/17 by Dr. Venita Lick. She is currently still on her activity restriction (no lifting >5#, no OH reaching/grabbing items, no activities that could strain her neck i.e. mopping, vacuuming, mowing, etc.) but is not any and ROM restrictions, per pt. She presents with deficits in cervical ROM, BUE strength, R>L, functional strength of the neck, and posture along with deficits in joint hypomobility throughout cervical and thoracic spine and mod-max soft tissue restrictions of surrounding musculature. She scored 23/50 on NDI indicating moderate self-perceived disability due to her neck pain. Pt needs skilled PT intervention to address these impairments in order to improve ROM and maximize  overall return to PLOF.     History and Personal Factors relevant to plan of care:  C5-6 TDA 09/09/17; h/o: asthma, endometriosis, GERD, hyperlipidemia, psoriasis, ulcerative collitis    Clinical Presentation  Stable    Clinical Presentation due to:  cervical ROM, BUE and cervical strength, NDI, jiont hypomobility, soft tissue restrictions, posture    Clinical Decision Making  Low    Rehab Potential  Good    PT Frequency  2x / week    PT Duration  4 weeks    PT Treatment/Interventions  ADLs/Self Care Home Management;Cryotherapy;Electrical Stimulation;Moist Heat;Ultrasound;Functional mobility training;Therapeutic activities;Therapeutic exercise;Balance training;Neuromuscular re-education;Patient/family education;Manual techniques;Scar mobilization;Passive range of motion;Dry needling;Energy conservation;Taping    PT Next Visit Plan  review goals and HEP; perform cervical MMT; begin manual for soft tissue restrictions, and PROM in supine; begin cervical isometrics in supine; postural education and strenghtening; thoracic mobility work    PT Home Exercise Plan  eval: supine retractions    Consulted and Agree with Plan of Care  Patient       Patient will benefit from skilled therapeutic intervention in order to  improve the following deficits and impairments:  Decreased activity tolerance, Decreased endurance, Decreased range of motion, Decreased strength, Hypomobility, Increased fascial restricitons, Increased muscle spasms, Impaired UE functional use, Improper body mechanics, Postural dysfunction, Pain  Visit Diagnosis: Cervicalgia - Plan: PT plan of care cert/re-cert  Muscle weakness (generalized) - Plan: PT plan of care cert/re-cert  Other symptoms and signs involving the musculoskeletal system - Plan: PT plan of care cert/re-cert     Problem List Patient Active Problem List   Diagnosis Date Noted  . 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 Reflux esophagitis 04/13/2012  . Esophageal reflux 04/13/2012        Jac Canavan PT, DPT  [place] [facility] [address] [place], [place], [address] Phone: [phone]   Fax:  [phone]  Name: RHYLEN PULIDO MRN: [id number] Date of Birth: Sep 21, 1965

## 2017-10-26 NOTE — Patient Instructions (Signed)
Access Code: [email or web address]  URL: [email or web address]  Date: 10/26/2017  Prepared by: Jac Canavan   Exercises Supine Chin Tuck - 10 reps - 1-3 sets - 5-10 hold - 1x daily - 7x weekly

## 2017-11-01 ENCOUNTER — Ambulatory Visit (HOSPITAL_COMMUNITY): Payer: 59 | Admitting: Physical Therapy

## 2017-11-01 DIAGNOSIS — R29898 Other symptoms and signs involving the musculoskeletal system: Secondary | ICD-10-CM

## 2017-11-01 DIAGNOSIS — M542 Cervicalgia: Secondary | ICD-10-CM | POA: Diagnosis not present

## 2017-11-01 DIAGNOSIS — M6281 Muscle weakness (generalized): Secondary | ICD-10-CM

## 2017-11-01 NOTE — Therapy (Signed)
[facility] [address] [place], [place], [address] Phone: [phone]   Fax:  [phone]  Physical Therapy Treatment  Patient Details  Name: [name] MRN: [id number] Date of Birth: 07/21/1965 Referring Provider: Venita Lick, MD   Encounter Date: 11/01/2017  PT End of Session - 11/01/17 1135    Visit Number  2    Number of Visits  9    Date for PT Re-Evaluation  11/23/17    Authorization Type  [employer] NAP - 100 visits per calendar year and only 4 modalities per day    Authorization Time Period  10/26/17 to 11/23/17    Authorization - Visit Number  2    Authorization - Number of Visits  100    PT Start Time  1118    PT Stop Time  1200    PT Time Calculation (min)  42 min    Activity Tolerance  Patient tolerated treatment well    Behavior During Therapy  [facility] for tasks assessed/performed       Past Medical History:  Diagnosis Date  . Asthma   . Endometriosis   . Eosinophilic esophagitis   . Feline esophagus   . GERD (gastroesophageal reflux disease)   . HSV-2 (herpes simplex virus 2) infection   . Hyperlipidemia   . Psoriasis   . Ulcerative colitis [facility])     Past Surgical History:  Procedure Laterality Date  . ABDOMINAL HYSTERECTOMY  11/2010  . COLONOSCOPY  04/13/2012   Procedure: COLONOSCOPY;  Surgeon: Hart Carwin, MD;  Location: [facility];  Service: Endoscopy;  Laterality: N/A;  . ESOPHAGOGASTRODUODENOSCOPY  04/13/2012   Procedure: ESOPHAGOGASTRODUODENOSCOPY (EGD);  Surgeon: Hart Carwin, MD;  Location: [facility];  Service: Endoscopy;  Laterality: N/A;  . LAPAROSCOPY  1990   For endometriosis   . TUBAL LIGATION  1999    There were no vitals filed for this visit.  Subjective Assessment - 11/01/17 1121    Subjective  PT states she has had sinus issues since she left here and has been mostly in the bed.  Reports to not completing HEP as she should due to her illness.  No pain currently.    Currently in Pain?   No/denies                       [facility] Adult PT Treatment/Exercise - 11/01/17 0001      Neck Exercises: Seated   W Back  10 reps    Other Seated Exercise  thoracic excursions with limited ROM UE movements (per restrictions) 5 reps each.    Other Seated Exercise  cervical exursions 5 reps each      Neck Exercises: Supine   Cervical Isometrics  Extension;Right rotation;Left rotation;Right lateral flexion;Left lateral flexion;Flexion;5 reps;3 secs    Neck Retraction  10 reps;5 secs      Manual Therapy   Manual Therapy  Passive ROM;Soft tissue mobilization    Manual therapy comments  completed seperately from all other skilled interventions    Soft tissue mobilization  to bilateral UT's, scaps and levators, Rt>Lt in sitting    Passive ROM  gentle ROM in supine             PT Education - 11/01/17 1129    Education provided  Yes    Education Details  reveiwed evaluation goals and reviewed HEP    Person(s) Educated  Patient    Methods  Explanation;Demonstration;Tactile cues;Verbal cues;Handout  Comprehension  Verbalized understanding;Returned demonstration;Verbal cues required;Tactile cues required       PT Short Term Goals - 10/26/17 1603      PT SHORT TERM GOAL #1   Title  Pt will be independent wtih HEP and perform consistently in order to decrease pain and improve ROM.    Time  2    Period  Weeks    Status  New    Target Date  11/09/17      PT SHORT TERM GOAL #2   Title  Pt will have improved cervical AROM throughout by 5 deg in order to maximize her function at home and her ability to drive.    Time  2    Period  Weeks    Status  New      PT SHORT TERM GOAL #3   Title  Pt will score 14/50 or < on the NDI to demo decreased self-perceived disability due to her neck pain.    Time  2    Period  Weeks    Status  New        PT Long Term Goals - 10/26/17 1604      PT LONG TERM GOAL #1   Title  Pt will have improved cervical AROM throughout by  10 deg or > in order to further maximize driving and allow her to perform functional tasks at home with greater ease.    Time  4    Period  Weeks    Status  New    Target Date  11/23/17      PT LONG TERM GOAL #2   Title  Pt will have 4/5 cervical MMT throughout to decrease pain and improve overall function.       PT LONG TERM GOAL #3   Title  Pt will be able to perform deep neck flexor endurance test for 60 seconds with proper form and without exacerbations in pain to demo improved overall cervical strength and decrease her pain with functional tasks.    Time  4    Period  Weeks    Status  New      PT LONG TERM GOAL #4   Title  Pt will have decreased soft tissue restrictions throughout cervical, thoracic, and periscapular musculature to min to mod in order to allow for improved ROM and decreased pain.    Time  4    Period  Weeks    Status  New            Plan - 11/01/17 1205    Clinical Impression Statement  Reveiwed evaluation goals and HEP initiated last session.  Progressed with gentle AROM for cervical and thoracic (with modified UE movements due to precaustions).  Pt very stiff in thoracic region, requiring AAROM to utilize this area.   Began manual cervical musculature with very little tightness and only one small spasm in Rt scap region able to decrease approx 85%.  Pt hypersensitive in this area at first but able to get relaxation mid treatment.  Pt reported no increase in symptoms at EOS.      Rehab Potential  Good    PT Frequency  2x / week    PT Duration  4 weeks    PT Treatment/Interventions  ADLs/Self Care Home Management;Cryotherapy;Electrical Stimulation;Moist Heat;Ultrasound;Functional mobility training;Therapeutic activities;Therapeutic exercise;Balance training;Neuromuscular re-education;Patient/family education;Manual techniques;Scar mobilization;Passive range of motion;Dry needling;Energy conservation;Taping    PT Next Visit Plan  Next session perform cervical  MMT.  continue with manual  for soft tissue restrictions PRN with postural education and strenghtening.    PT Home Exercise Plan  eval: supine retractions  5/14: cervical excursions in sitting    Consulted and Agree with Plan of Care  Patient       Patient will benefit from skilled therapeutic intervention in order to improve the following deficits and impairments:  Decreased activity tolerance, Decreased endurance, Decreased range of motion, Decreased strength, Hypomobility, Increased fascial restricitons, Increased muscle spasms, Impaired UE functional use, Improper body mechanics, Postural dysfunction, Pain  Visit Diagnosis: Cervicalgia  Muscle weakness (generalized)  Other symptoms and signs involving the musculoskeletal system     Problem List Patient Active Problem List   Diagnosis Date Noted  . 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 Reflux esophagitis 04/13/2012  . Esophageal reflux 04/13/2012   Lurena Nida, PTA/CLT [phone]  Lurena Nida 11/01/2017, 12:09 PM  [place] [facility] [address] [place], [place], [address] Phone: [phone]   Fax:  [phone]  Name: KAMARAH BILOTTA MRN: [id number] Date of Birth: [birth year]

## 2017-11-03 ENCOUNTER — Ambulatory Visit (HOSPITAL_COMMUNITY): Payer: 59

## 2017-11-03 ENCOUNTER — Encounter (HOSPITAL_COMMUNITY): Payer: Self-pay

## 2017-11-03 DIAGNOSIS — M542 Cervicalgia: Secondary | ICD-10-CM | POA: Diagnosis not present

## 2017-11-03 DIAGNOSIS — R29898 Other symptoms and signs involving the musculoskeletal system: Secondary | ICD-10-CM

## 2017-11-03 DIAGNOSIS — M6281 Muscle weakness (generalized): Secondary | ICD-10-CM

## 2017-11-03 NOTE — Therapy (Signed)
[facility] [address] [place], [place], [address] Phone: [phone]   Fax:  [phone]  Physical Therapy Treatment  Patient Details  Name: [name] MRN: [id number] Date of Birth: 02/01/1966 Referring Provider: Venita Lick, MD   Encounter Date: 11/03/2017  PT End of Session - 11/03/17 0945    Visit Number  3    Number of Visits  9    Date for PT Re-Evaluation  11/23/17    Authorization Type  [employer] NAP - 100 visits per calendar year and only 4 modalities per day    Authorization Time Period  10/26/17 to 11/23/17    Authorization - Visit Number  3    Authorization - Number of Visits  100    PT Start Time  0944    PT Stop Time  1026    PT Time Calculation (min)  42 min    Activity Tolerance  Patient tolerated treatment well    Behavior During Therapy  [facility] for tasks assessed/performed       Past Medical History:  Diagnosis Date  . Asthma   . Endometriosis   . Eosinophilic esophagitis   . Feline esophagus   . GERD (gastroesophageal reflux disease)   . HSV-2 (herpes simplex virus 2) infection   . Hyperlipidemia   . Psoriasis   . Ulcerative colitis [facility])     Past Surgical History:  Procedure Laterality Date  . ABDOMINAL HYSTERECTOMY  11/2010  . COLONOSCOPY  04/13/2012   Procedure: COLONOSCOPY;  Surgeon: Hart Carwin, MD;  Location: [facility];  Service: Endoscopy;  Laterality: N/A;  . ESOPHAGOGASTRODUODENOSCOPY  04/13/2012   Procedure: ESOPHAGOGASTRODUODENOSCOPY (EGD);  Surgeon: Hart Carwin, MD;  Location: [facility];  Service: Endoscopy;  Laterality: N/A;  . LAPAROSCOPY  1990   For endometriosis   . TUBAL LIGATION  1999    There were no vitals filed for this visit.  Subjective Assessment - 11/03/17 0945    Subjective  Pt states that her neck is better. She's not feeling as tight across the shoulders. HEP compliance reported.     Currently in Pain?  No/denies         [facility] PT Assessment - 11/03/17 0001       ROM / Strength   AROM / PROM / Strength  Strength      Strength   Strength Assessment Site  Cervical    Cervical Flexion  4+/5    Cervical Extension  4+/5    Cervical - Right Side Bend  4-/5    Cervical - Left Side Bend  4-/5    Cervical - Right Rotation  4-/5    Cervical - Left Rotation  4-/5         [facility] Adult PT Treatment/Exercise - 11/03/17 0001      Neck Exercises: Theraband   Scapula Retraction  15 reps;Red;Limitations    Scapula Retraction Limitations  2 sets    Shoulder Extension  15 reps;Red;Limitations    Shoulder Extension Limitations  2 sets      Neck Exercises: Seated   Neck Retraction  10 reps;5 secs;Limitations    Neck Retraction Limitations  2 sets    W Back  15 reps    Shoulder Rolls  Backwards;10 reps    Other Seated Exercise  thoracic excursions with limited ROM UE movements (per restrictions) 5 reps each.      Neck Exercises: Supine   Cervical Isometrics  Extension;Right rotation;Left rotation;Right lateral flexion;Left  lateral flexion;Flexion;5 reps;3 secs      Manual Therapy   Manual Therapy  Passive ROM;Soft tissue mobilization    Manual therapy comments  completed seperately from all other skilled interventions    Soft tissue mobilization  bil UT, levator scap, cervical paraspinals in supine    Passive ROM  gentle ROM in supine      Neck Exercises: Stretches   Upper Trapezius Stretch  Right;Left;3 reps;30 seconds    Levator Stretch  Right;Left;3 reps;30 seconds             PT Education - 11/03/17 0945    Education provided  Yes    Education Details  exercise technique    Person(s) Educated  Patient    Methods  Explanation;Demonstration    Comprehension  Verbalized understanding;Returned demonstration       PT Short Term Goals - 10/26/17 1603      PT SHORT TERM GOAL #1   Title  Pt will be independent wtih HEP and perform consistently in order to decrease pain and improve ROM.    Time  2    Period  Weeks    Status  New     Target Date  11/09/17      PT SHORT TERM GOAL #2   Title  Pt will have improved cervical AROM throughout by 5 deg in order to maximize her function at home and her ability to drive.    Time  2    Period  Weeks    Status  New      PT SHORT TERM GOAL #3   Title  Pt will score 14/50 or < on the NDI to demo decreased self-perceived disability due to her neck pain.    Time  2    Period  Weeks    Status  New        PT Long Term Goals - 10/26/17 1604      PT LONG TERM GOAL #1   Title  Pt will have improved cervical AROM throughout by 10 deg or > in order to further maximize driving and allow her to perform functional tasks at home with greater ease.    Time  4    Period  Weeks    Status  New    Target Date  11/23/17      PT LONG TERM GOAL #2   Title  Pt will have 4/5 cervical MMT throughout to decrease pain and improve overall function.       PT LONG TERM GOAL #3   Title  Pt will be able to perform deep neck flexor endurance test for 60 seconds with proper form and without exacerbations in pain to demo improved overall cervical strength and decrease her pain with functional tasks.    Time  4    Period  Weeks    Status  New      PT LONG TERM GOAL #4   Title  Pt will have decreased soft tissue restrictions throughout cervical, thoracic, and periscapular musculature to min to mod in order to allow for improved ROM and decreased pain.    Time  4    Period  Weeks    Status  New            Plan - 11/03/17 1028    Clinical Impression Statement  Pt presenting to therapy with reports of improvements in neck pain/shoulder pain. Progressed her to seated cervical retractions with good form; only required very minimal cues  at first. Also added upper trap and levator scap stretching this date; pt tighter and felt more of a stretch on the R side. Continued with mobility work, all within pt's precautions. Ended with manual STM to continue to address soft tissue restrictions and decrease  pain; pt with palpable taut bands which are tender to palpation. Educated pt on trigger point dry needling and will f/u with Dr. Shon Baton to see if he will allow [place] to dry needle since she is relatively acute in her surgical recovery. Continue as planned progressing as able.     Rehab Potential  Good    PT Frequency  2x / week    PT Duration  4 weeks    PT Treatment/Interventions  ADLs/Self Care Home Management;Cryotherapy;Electrical Stimulation;Moist Heat;Ultrasound;Functional mobility training;Therapeutic activities;Therapeutic exercise;Balance training;Neuromuscular re-education;Patient/family education;Manual techniques;Scar mobilization;Passive range of motion;Dry needling;Energy conservation;Taping    PT Next Visit Plan  continue with manual for soft tissue restrictions; continue and postural strenghtening within UE precautions; add scalene stretching    PT Home Exercise Plan  eval: supine retractions  5/14: cervical excursions in sitting; 5/16: upper trap and levator scap stretching    Consulted and Agree with Plan of Care  Patient       Patient will benefit from skilled therapeutic intervention in order to improve the following deficits and impairments:  Decreased activity tolerance, Decreased endurance, Decreased range of motion, Decreased strength, Hypomobility, Increased fascial restricitons, Increased muscle spasms, Impaired UE functional use, Improper body mechanics, Postural dysfunction, Pain  Visit Diagnosis: Cervicalgia  Muscle weakness (generalized)  Other symptoms and signs involving the musculoskeletal system     Problem List Patient Active Problem List   Diagnosis Date Noted  . 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 Reflux esophagitis 04/13/2012  . Esophageal reflux 04/13/2012       Jac Canavan PT, DPT  [facility] [address] [place], [place], [address] Phone: [phone]   Fax:   [phone]  Name: [name] MRN: [id number] Date of Birth: 1965-10-14

## 2017-11-09 ENCOUNTER — Ambulatory Visit (HOSPITAL_COMMUNITY): Payer: 59

## 2017-11-09 ENCOUNTER — Encounter (HOSPITAL_COMMUNITY): Payer: Self-pay

## 2017-11-09 DIAGNOSIS — R29898 Other symptoms and signs involving the musculoskeletal system: Secondary | ICD-10-CM

## 2017-11-09 DIAGNOSIS — M542 Cervicalgia: Secondary | ICD-10-CM

## 2017-11-09 DIAGNOSIS — M6281 Muscle weakness (generalized): Secondary | ICD-10-CM

## 2017-11-09 NOTE — Therapy (Signed)
[facility] [address] [place], [place], [address] Phone: [phone]   Fax:  [phone]  Physical Therapy Treatment  Patient Details  Name: [name] MRN: [id number] Date of Birth: 1965-10-19 Referring Provider: Venita Lick, MD   Encounter Date: 11/09/2017  PT End of Session - 11/09/17 1036    Visit Number  4    Number of Visits  9    Date for PT Re-Evaluation  11/23/17    Authorization Type  [employer] NAP - 100 visits per calendar year and only 4 modalities per day    Authorization Time Period  10/26/17 to 11/23/17    Authorization - Visit Number  4    Authorization - Number of Visits  100    PT Start Time  1031    PT Stop Time  1111    PT Time Calculation (min)  40 min    Activity Tolerance  Patient tolerated treatment well    Behavior During Therapy  [facility] for tasks assessed/performed       Past Medical History:  Diagnosis Date  . Asthma   . Endometriosis   . Eosinophilic esophagitis   . Feline esophagus   . GERD (gastroesophageal reflux disease)   . HSV-2 (herpes simplex virus 2) infection   . Hyperlipidemia   . Psoriasis   . Ulcerative colitis [facility])     Past Surgical History:  Procedure Laterality Date  . ABDOMINAL HYSTERECTOMY  11/2010  . COLONOSCOPY  04/13/2012   Procedure: COLONOSCOPY;  Surgeon: Hart Carwin, MD;  Location: [facility];  Service: Endoscopy;  Laterality: N/A;  . ESOPHAGOGASTRODUODENOSCOPY  04/13/2012   Procedure: ESOPHAGOGASTRODUODENOSCOPY (EGD);  Surgeon: Hart Carwin, MD;  Location: [facility];  Service: Endoscopy;  Laterality: N/A;  . LAPAROSCOPY  1990   For endometriosis   . TUBAL LIGATION  1999    There were no vitals filed for this visit.  Subjective Assessment - 11/09/17 1034    Subjective  Pt states that she had a really bad HA and increased soreness to her R shoulder region where the manual was performed. She is now feeling better and reports that she feels looser from prior to  the manual.     Currently in Pain?  No/denies            [facility] Adult PT Treatment/Exercise - 11/09/17 0001      Neck Exercises: Seated   Money  15 reps    Shoulder Rolls  Backwards;15 reps    Postural Training  scap retraction x15 reps      Manual Therapy   Manual Therapy  Soft tissue mobilization;Manual Traction    Manual therapy comments  completed seperately from all other skilled interventions    Soft tissue mobilization  STM to R upper trap after needling    Manual Traction  manual R upper trap stretch in supine 2x30"      Neck Exercises: Stretches   Upper Trapezius Stretch  Right;Left;3 reps;30 seconds    Other Neck Stretches  pec stretch in doorway 2x20"       Trigger Point Dry Needling - 11/09/17 1104    Consent Given?  Yes    Education Handout Provided  Yes    Muscles Treated Upper Body  Upper trapezius    Upper Trapezius Response  Twitch reponse elicited;Palpable increased muscle length right in supine           PT Education - 11/09/17 1116    Education  provided  Yes    Education Details  trigger point dry needling    Person(s) Educated  Patient    Methods  Explanation;Demonstration;Handout    Comprehension  Verbalized understanding;Returned demonstration         PT Short Term Goals - 10/26/17 1603      PT SHORT TERM GOAL #1   Title  Pt will be independent wtih HEP and perform consistently in order to decrease pain and improve ROM.    Time  2    Period  Weeks    Status  New    Target Date  11/09/17      PT SHORT TERM GOAL #2   Title  Pt will have improved cervical AROM throughout by 5 deg in order to maximize her function at home and her ability to drive.    Time  2    Period  Weeks    Status  New      PT SHORT TERM GOAL #3   Title  Pt will score 14/50 or < on the NDI to demo decreased self-perceived disability due to her neck pain.    Time  2    Period  Weeks    Status  New        PT Long Term Goals - 10/26/17 1604      PT LONG  TERM GOAL #1   Title  Pt will have improved cervical AROM throughout by 10 deg or > in order to further maximize driving and allow her to perform functional tasks at home with greater ease.    Time  4    Period  Weeks    Status  New    Target Date  11/23/17      PT LONG TERM GOAL #2   Title  Pt will have 4/5 cervical MMT throughout to decrease pain and improve overall function.       PT LONG TERM GOAL #3   Title  Pt will be able to perform deep neck flexor endurance test for 60 seconds with proper form and without exacerbations in pain to demo improved overall cervical strength and decrease her pain with functional tasks.    Time  4    Period  Weeks    Status  New      PT LONG TERM GOAL #4   Title  Pt will have decreased soft tissue restrictions throughout cervical, thoracic, and periscapular musculature to min to mod in order to allow for improved ROM and decreased pain.    Time  4    Period  Weeks    Status  New            Plan - 11/09/17 1116    Clinical Impression Statement  Pt presents to therapy reporting that she had increased muscle soreness and some HA following the manual last session but has since started feel better. PT reassured pt that that was a normal response to manual therapy. PT obtained clearance from MD to dry needle and pt was agreeable to try it this date after hearing the risks and benefits. PT able to get good twitch response out of pt's R upper trap and she reported referred pain into head. Ended with manual STM, stretching, and muscle activation/relaxation techniques at EOS to further decrease soft tissue restrictions and decrease pain. Pt reported feeling slight tighter at EOS but not too bad. PT again educated pt that she would likely experience increased muscle soreness following but that it should  be better in the next 24-48 hours and she verbalized understanding. Continue as planned, progressing as able.     Rehab Potential  Good    PT Frequency  2x /  week    PT Duration  4 weeks    PT Treatment/Interventions  ADLs/Self Care Home Management;Cryotherapy;Electrical Stimulation;Moist Heat;Ultrasound;Functional mobility training;Therapeutic activities;Therapeutic exercise;Balance training;Neuromuscular re-education;Patient/family education;Manual techniques;Scar mobilization;Passive range of motion;Dry needling;Energy conservation;Taping    PT Next Visit Plan  continue with manual for soft tissue restrictions; continue and postural strenghtening within UE precautions; add scalene stretching, continue pec stretching; continue dry needling as needed for pain and soft tissue restrictions    PT Home Exercise Plan  eval: supine retractions  5/14: cervical excursions in sitting; 5/16: upper trap and levator scap stretching    Consulted and Agree with Plan of Care  Patient       Patient will benefit from skilled therapeutic intervention in order to improve the following deficits and impairments:  Decreased activity tolerance, Decreased endurance, Decreased range of motion, Decreased strength, Hypomobility, Increased fascial restricitons, Increased muscle spasms, Impaired UE functional use, Improper body mechanics, Postural dysfunction, Pain  Visit Diagnosis: Cervicalgia  Muscle weakness (generalized)  Other symptoms and signs involving the musculoskeletal system     Problem List Patient Active Problem List   Diagnosis Date Noted  . 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 Reflux esophagitis 04/13/2012  . Esophageal reflux 04/13/2012       Jac Canavan PT, DPT  [facility] [address] [place], [place], [address] Phone: [phone]   Fax:  [phone]  Name: [name] MRN: [id number] Date of Birth: [birth year]

## 2017-11-09 NOTE — Patient Instructions (Signed)

## 2017-11-11 ENCOUNTER — Ambulatory Visit (HOSPITAL_COMMUNITY): Payer: 59

## 2017-11-11 DIAGNOSIS — R29898 Other symptoms and signs involving the musculoskeletal system: Secondary | ICD-10-CM

## 2017-11-11 DIAGNOSIS — M542 Cervicalgia: Secondary | ICD-10-CM

## 2017-11-11 DIAGNOSIS — M6281 Muscle weakness (generalized): Secondary | ICD-10-CM

## 2017-11-11 NOTE — Therapy (Signed)
[facility] [address] [place], [place], [address] Phone: [phone]   Fax:  [phone]  Physical Therapy Treatment  Patient Details  Name: [name] MRN: [id number] Date of Birth: 12/29/1965 Referring Provider: Venita Lick, MD   Encounter Date: 11/11/2017  PT End of Session - 11/11/17 0900    Visit Number  5    Number of Visits  9    Date for PT Re-Evaluation  11/23/17    Authorization Type  [employer] NAP - 100 visits per calendar year and only 4 modalities per day    Authorization Time Period  10/26/17 to 11/23/17    Authorization - Visit Number  5    Authorization - Number of Visits  100    PT Start Time  0900    PT Stop Time  0941    PT Time Calculation (min)  41 min    Activity Tolerance  Patient tolerated treatment well    Behavior During Therapy  [facility] for tasks assessed/performed       Past Medical History:  Diagnosis Date  . Asthma   . Endometriosis   . Eosinophilic esophagitis   . Feline esophagus   . GERD (gastroesophageal reflux disease)   . HSV-2 (herpes simplex virus 2) infection   . Hyperlipidemia   . Psoriasis   . Ulcerative colitis [facility])     Past Surgical History:  Procedure Laterality Date  . ABDOMINAL HYSTERECTOMY  11/2010  . COLONOSCOPY  04/13/2012   Procedure: COLONOSCOPY;  Surgeon: Hart Carwin, MD;  Location: [facility];  Service: Endoscopy;  Laterality: N/A;  . ESOPHAGOGASTRODUODENOSCOPY  04/13/2012   Procedure: ESOPHAGOGASTRODUODENOSCOPY (EGD);  Surgeon: Hart Carwin, MD;  Location: [facility];  Service: Endoscopy;  Laterality: N/A;  . LAPAROSCOPY  1990   For endometriosis   . TUBAL LIGATION  1999    There were no vitals filed for this visit.  Subjective Assessment - 11/11/17 0900    Subjective  Pt states that she did not have any issues yesterday and she states that the knot is gone. She's not having any pain currently.     Currently in Pain?  No/denies           [facility] Adult PT  Treatment/Exercise - 11/11/17 0001      Neck Exercises: Machines for Strengthening   UBE (Upper Arm Bike)  retro, L1, postural strengthening      Neck Exercises: Theraband   Scapula Retraction  10 reps;Red    Scapula Retraction Limitations  3 sets, +cervical retraction in standing    Shoulder Extension  10 reps;Red    Shoulder Extension Limitations  3 sets, +cervical retraction, standing    Rows  10 reps;Red    Rows Limitations  3 sets, +cervical retraction, stadning    Shoulder External Rotation  10 reps;Red;Limitations    Shoulder External Rotation Limitations  2 set, towel roll, BUE    Shoulder Internal Rotation  10 reps;Red;Limitations    Shoulder Internal Rotation Limitations  2 sets, towel roll, BUE      Neck Exercises: Standing   Wall Push Ups  10 reps;Limitations    Wall Push Ups Limitations  2 sets      Manual Therapy   Manual Therapy  Soft tissue mobilization    Manual therapy comments  completed seperately from all other skilled interventions    Soft tissue mobilization  STM R upper trap in supine      Neck Exercises: Stretches  Other Neck Stretches  pec stretch in doorway 2x30"    Other Neck Stretches  scalenes stretch with towel 2x30" each          PT Education - 11/11/17 0900    Education provided  Yes    Education Details  exercise technique, continue HEP    Person(s) Educated  Patient    Methods  Explanation;Demonstration    Comprehension  Verbalized understanding;Returned demonstration       PT Short Term Goals - 10/26/17 1603      PT SHORT TERM GOAL #1   Title  Pt will be independent wtih HEP and perform consistently in order to decrease pain and improve ROM.    Time  2    Period  Weeks    Status  New    Target Date  11/09/17      PT SHORT TERM GOAL #2   Title  Pt will have improved cervical AROM throughout by 5 deg in order to maximize her function at home and her ability to drive.    Time  2    Period  Weeks    Status  New      PT SHORT  TERM GOAL #3   Title  Pt will score 14/50 or < on the NDI to demo decreased self-perceived disability due to her neck pain.    Time  2    Period  Weeks    Status  New        PT Long Term Goals - 10/26/17 1604      PT LONG TERM GOAL #1   Title  Pt will have improved cervical AROM throughout by 10 deg or > in order to further maximize driving and allow her to perform functional tasks at home with greater ease.    Time  4    Period  Weeks    Status  New    Target Date  11/23/17      PT LONG TERM GOAL #2   Title  Pt will have 4/5 cervical MMT throughout to decrease pain and improve overall function.       PT LONG TERM GOAL #3   Title  Pt will be able to perform deep neck flexor endurance test for 60 seconds with proper form and without exacerbations in pain to demo improved overall cervical strength and decrease her pain with functional tasks.    Time  4    Period  Weeks    Status  New      PT LONG TERM GOAL #4   Title  Pt will have decreased soft tissue restrictions throughout cervical, thoracic, and periscapular musculature to min to mod in order to allow for improved ROM and decreased pain.    Time  4    Period  Weeks    Status  New            Plan - 11/11/17 1610    Clinical Impression Statement  Pt stating that she was not sore at all from the dry needling but she did take an Aleve when she got home. No reports of pain this date and pt able to tolerated progressed scapular and postural strengthening without issues and within precautions. Min cues for proper technique required. Pt with much improved soft tissue restrictions of R upper trap, but she still has a taut band which is tender to palpation. Ended with manual therapy to address this and decrease pain and improve ROM. Pt progressing nicely towards  goals.     Rehab Potential  Good    PT Frequency  2x / week    PT Duration  4 weeks    PT Treatment/Interventions  ADLs/Self Care Home Management;Cryotherapy;Electrical  Stimulation;Moist Heat;Ultrasound;Functional mobility training;Therapeutic activities;Therapeutic exercise;Balance training;Neuromuscular re-education;Patient/family education;Manual techniques;Scar mobilization;Passive range of motion;Dry needling;Energy conservation;Taping    PT Next Visit Plan  continue with manual for soft tissue restrictions; continue and postural strenghtening within UE precautions; continue scalene stretching, continue pec stretching; continue dry needling as needed for pain and soft tissue restrictions    PT Home Exercise Plan  eval: supine retractions  5/14: cervical excursions in sitting; 5/16: upper trap and levator scap stretching    Consulted and Agree with Plan of Care  Patient       Patient will benefit from skilled therapeutic intervention in order to improve the following deficits and impairments:  Decreased activity tolerance, Decreased endurance, Decreased range of motion, Decreased strength, Hypomobility, Increased fascial restricitons, Increased muscle spasms, Impaired UE functional use, Improper body mechanics, Postural dysfunction, Pain  Visit Diagnosis: Cervicalgia  Muscle weakness (generalized)  Other symptoms and signs involving the musculoskeletal system     Problem List Patient Active Problem List   Diagnosis Date Noted  . 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 Reflux esophagitis 04/13/2012  . Esophageal reflux 04/13/2012       Jac Canavan PT, DPT  [facility] [address] [place], [place], [address] Phone: [phone]   Fax:  [phone]  Name: [name] MRN: [id number] Date of Birth: 03-06-1966

## 2017-11-16 ENCOUNTER — Ambulatory Visit (HOSPITAL_COMMUNITY): Payer: 59 | Admitting: Physical Therapy

## 2017-11-16 DIAGNOSIS — M542 Cervicalgia: Secondary | ICD-10-CM | POA: Diagnosis not present

## 2017-11-16 DIAGNOSIS — R29898 Other symptoms and signs involving the musculoskeletal system: Secondary | ICD-10-CM

## 2017-11-16 DIAGNOSIS — M6281 Muscle weakness (generalized): Secondary | ICD-10-CM

## 2017-11-16 NOTE — Therapy (Signed)
[facility] [address] [place], [place], [address] Phone: [phone]   Fax:  [phone]  Physical Therapy Treatment  Patient Details  Name: [name] MRN: [id number] Date of Birth: 1965/10/12 Referring Provider: Venita Lick, MD   Encounter Date: 11/16/2017  PT End of Session - 11/16/17 1204    Visit Number  6    Number of Visits  9    Date for PT Re-Evaluation  11/23/17    Authorization Type  [employer] NAP - 100 visits per calendar year and only 4 modalities per day    Authorization Time Period  10/26/17 to 11/23/17    Authorization - Visit Number  6    Authorization - Number of Visits  100    PT Start Time  1121    PT Stop Time  1203    PT Time Calculation (min)  42 min    Activity Tolerance  Patient tolerated treatment well    Behavior During Therapy  [facility] for tasks assessed/performed       Past Medical History:  Diagnosis Date  . Asthma   . Endometriosis   . Eosinophilic esophagitis   . Feline esophagus   . GERD (gastroesophageal reflux disease)   . HSV-2 (herpes simplex virus 2) infection   . Hyperlipidemia   . Psoriasis   . Ulcerative colitis [facility])     Past Surgical History:  Procedure Laterality Date  . ABDOMINAL HYSTERECTOMY  11/2010  . COLONOSCOPY  04/13/2012   Procedure: COLONOSCOPY;  Surgeon: Hart Carwin, MD;  Location: [facility];  Service: Endoscopy;  Laterality: N/A;  . ESOPHAGOGASTRODUODENOSCOPY  04/13/2012   Procedure: ESOPHAGOGASTRODUODENOSCOPY (EGD);  Surgeon: Hart Carwin, MD;  Location: [facility];  Service: Endoscopy;  Laterality: N/A;  . LAPAROSCOPY  1990   For endometriosis   . TUBAL LIGATION  1999    There were no vitals filed for this visit.  Subjective Assessment - 11/16/17 1132    Subjective  PT states the dry needling really helped and she is only having 2/10 pain in her RT UT/shoulder area.      Currently in Pain?  Yes    Pain Score  2     Pain Location  Shoulder    Pain  Orientation  Right    Pain Descriptors / Indicators  Aching                       [facility] Adult PT Treatment/Exercise - 11/16/17 0001      Neck Exercises: Machines for Strengthening   UBE (Upper Arm Bike)  retro, L1, postural strengthening      Neck Exercises: Theraband   Scapula Retraction  10 reps;Red    Scapula Retraction Limitations  3 sets, +cervical retraction in standing    Shoulder Extension  10 reps;Red    Shoulder Extension Limitations  3 sets, +cervical retraction, standing    Rows  10 reps;Red    Rows Limitations  3 sets, +cervical retraction, stadning    Shoulder External Rotation  10 reps;Red;Limitations    Shoulder External Rotation Limitations  2 set, towel roll, BUE    Shoulder Internal Rotation  10 reps;Red;Limitations    Shoulder Internal Rotation Limitations  2 sets, towel roll, BUE      Neck Exercises: Standing   Wall Push Ups  10 reps;Limitations    Wall Push Ups Limitations  2 sets      Manual Therapy   Manual Therapy  Soft tissue mobilization    Manual therapy comments  completed seperately from all other skilled interventions    Soft tissue mobilization  STM R upper trap in supine      Neck Exercises: Stretches   Other Neck Stretches  pec stretch in doorway 2x30"               PT Short Term Goals - 10/26/17 1603      PT SHORT TERM GOAL #1   Title  Pt will be independent wtih HEP and perform consistently in order to decrease pain and improve ROM.    Time  2    Period  Weeks    Status  New    Target Date  11/09/17      PT SHORT TERM GOAL #2   Title  Pt will have improved cervical AROM throughout by 5 deg in order to maximize her function at home and her ability to drive.    Time  2    Period  Weeks    Status  New      PT SHORT TERM GOAL #3   Title  Pt will score 14/50 or < on the NDI to demo decreased self-perceived disability due to her neck pain.    Time  2    Period  Weeks    Status  New        PT Long Term  Goals - 10/26/17 1604      PT LONG TERM GOAL #1   Title  Pt will have improved cervical AROM throughout by 10 deg or > in order to further maximize driving and allow her to perform functional tasks at home with greater ease.    Time  4    Period  Weeks    Status  New    Target Date  11/23/17      PT LONG TERM GOAL #2   Title  Pt will have 4/5 cervical MMT throughout to decrease pain and improve overall function.       PT LONG TERM GOAL #3   Title  Pt will be able to perform deep neck flexor endurance test for 60 seconds with proper form and without exacerbations in pain to demo improved overall cervical strength and decrease her pain with functional tasks.    Time  4    Period  Weeks    Status  New      PT LONG TERM GOAL #4   Title  Pt will have decreased soft tissue restrictions throughout cervical, thoracic, and periscapular musculature to min to mod in order to allow for improved ROM and decreased pain.    Time  4    Period  Weeks    Status  New            Plan - 11/16/17 1204    Clinical Impression Statement  Pt overall improving with minimal aggrevation in Rt trap region and no cervical pain.  Pt only with 1 small spasm in Rt upper trap that was easily resolved with manual techniques.  completed all strengthening exercises today without issues or complaints of pain.     Rehab Potential  Good    PT Frequency  2x / week    PT Duration  4 weeks    PT Treatment/Interventions  ADLs/Self Care Home Management;Cryotherapy;Electrical Stimulation;Moist Heat;Ultrasound;Functional mobility training;Therapeutic activities;Therapeutic exercise;Balance training;Neuromuscular re-education;Patient/family education;Manual techniques;Scar mobilization;Passive range of motion;Dry needling;Energy conservation;Taping    PT Next Visit Plan  continue with manual for  soft tissue restrictions; continue and postural strenghtening within UE precautions; continue scalene stretching, continue pec  stretching; continue dry needling as needed for pain and soft tissue restrictions    PT Home Exercise Plan  eval: supine retractions  5/14: cervical excursions in sitting; 5/16: upper trap and levator scap stretching    Consulted and Agree with Plan of Care  Patient       Patient will benefit from skilled therapeutic intervention in order to improve the following deficits and impairments:  Decreased activity tolerance, Decreased endurance, Decreased range of motion, Decreased strength, Hypomobility, Increased fascial restricitons, Increased muscle spasms, Impaired UE functional use, Improper body mechanics, Postural dysfunction, Pain  Visit Diagnosis: Cervicalgia  Muscle weakness (generalized)  Other symptoms and signs involving the musculoskeletal system     Problem List Patient Active Problem List   Diagnosis Date Noted  . 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 Reflux esophagitis 04/13/2012  . Esophageal reflux 04/13/2012   Lurena Nida, PTA/CLT [phone]  Lurena Nida 11/16/2017, 12:06 PM  [facility] [address] [place], [place], [address] Phone: [phone]   Fax:  [phone]  Name: [name] MRN: [id number] Date of Birth: 01-Jul-1965

## 2017-11-18 ENCOUNTER — Ambulatory Visit (HOSPITAL_COMMUNITY): Payer: 59

## 2017-11-18 DIAGNOSIS — R29898 Other symptoms and signs involving the musculoskeletal system: Secondary | ICD-10-CM

## 2017-11-18 DIAGNOSIS — M6281 Muscle weakness (generalized): Secondary | ICD-10-CM

## 2017-11-18 DIAGNOSIS — M542 Cervicalgia: Secondary | ICD-10-CM

## 2017-11-18 NOTE — Therapy (Signed)
[facility] [address] [place], [place], [address] Phone: [phone]   Fax:  [phone]  Physical Therapy Treatment  Patient Details  Name: [name] MRN: [id number] Date of Birth: September 24, 1965 Referring Provider: Venita Lick, MD   Encounter Date: 11/18/2017  PT End of Session - 11/18/17 1432    Visit Number  7    Number of Visits  9    Date for PT Re-Evaluation  11/23/17    Authorization Type  [employer] NAP - 100 visits per calendar year and only 4 modalities per day    Authorization Time Period  10/26/17 to 11/23/17    Authorization - Visit Number  7    Authorization - Number of Visits  100    PT Start Time  1430    PT Stop Time  1445    PT Time Calculation (min)  15 min    Activity Tolerance  Patient tolerated treatment well    Behavior During Therapy  [facility] for tasks assessed/performed       Past Medical History:  Diagnosis Date  . Asthma   . Endometriosis   . Eosinophilic esophagitis   . Feline esophagus   . GERD (gastroesophageal reflux disease)   . HSV-2 (herpes simplex virus 2) infection   . Hyperlipidemia   . Psoriasis   . Ulcerative colitis [facility])     Past Surgical History:  Procedure Laterality Date  . ABDOMINAL HYSTERECTOMY  11/2010  . COLONOSCOPY  04/13/2012   Procedure: COLONOSCOPY;  Surgeon: Hart Carwin, MD;  Location: [facility];  Service: Endoscopy;  Laterality: N/A;  . ESOPHAGOGASTRODUODENOSCOPY  04/13/2012   Procedure: ESOPHAGOGASTRODUODENOSCOPY (EGD);  Surgeon: Hart Carwin, MD;  Location: [facility];  Service: Endoscopy;  Laterality: N/A;  . LAPAROSCOPY  1990   For endometriosis   . TUBAL LIGATION  1999    There were no vitals filed for this visit.  Subjective Assessment - 11/18/17 1432    Subjective  Pt states she saw her MD this morning. She was told she has degenerative changes in her R RTC. She had increased pain in her R shoulder following alst session as well. He was very pleased with  her neck and cleared her of all of her restrictions, just told her to take it easy getting back into everything.      Currently in Pain?  Yes    Pain Score  1     Pain Location  Shoulder    Pain Orientation  Right    Pain Descriptors / Indicators  Dull    Pain Type  Chronic pain    Pain Onset  More than a month ago    Pain Frequency  Constant           [facility] PT Assessment - 11/18/17 0001      Assessment   Medical Diagnosis  encounter for other specified surgical aftercare (post-op TDA C5-6)    Referring Provider  Venita Lick, MD    Onset Date/Surgical Date  09/09/17    Next MD Visit  3 months        PT Education - 11/18/17 1454    Education provided  Yes    Education Details  will reassess next visit    Person(s) Educated  Patient    Methods  Explanation    Comprehension  Verbalized understanding          PT Short Term Goals - 10/26/17 1603  PT SHORT TERM GOAL #1   Title  Pt will be independent wtih HEP and perform consistently in order to decrease pain and improve ROM.    Time  2    Period  Weeks    Status  New    Target Date  11/09/17      PT SHORT TERM GOAL #2   Title  Pt will have improved cervical AROM throughout by 5 deg in order to maximize her function at home and her ability to drive.    Time  2    Period  Weeks    Status  New      PT SHORT TERM GOAL #3   Title  Pt will score 14/50 or < on the NDI to demo decreased self-perceived disability due to her neck pain.    Time  2    Period  Weeks    Status  New        PT Long Term Goals - 10/26/17 1604      PT LONG TERM GOAL #1   Title  Pt will have improved cervical AROM throughout by 10 deg or > in order to further maximize driving and allow her to perform functional tasks at home with greater ease.    Time  4    Period  Weeks    Status  New    Target Date  11/23/17      PT LONG TERM GOAL #2   Title  Pt will have 4/5 cervical MMT throughout to decrease pain and improve overall function.        PT LONG TERM GOAL #3   Title  Pt will be able to perform deep neck flexor endurance test for 60 seconds with proper form and without exacerbations in pain to demo improved overall cervical strength and decrease her pain with functional tasks.    Time  4    Period  Weeks    Status  New      PT LONG TERM GOAL #4   Title  Pt will have decreased soft tissue restrictions throughout cervical, thoracic, and periscapular musculature to min to mod in order to allow for improved ROM and decreased pain.    Time  4    Period  Weeks    Status  New            Plan - 11/18/17 1453    Clinical Impression Statement  Pt presents to therapy this date stating that she saw Dr. Shon Baton this morning and states that he was very pleased with her neck surgery and the progress she has made. He lifted all of her restrictions but told her to steadily return to PLOF. She is however, complaining of R shoulder pain, which she states has been elevated since her last treatment session. He gave her a shot in it today and it has helped a great deal. He gave her a referral for dry needling for her shoulder if the PT deemed it appropriate but she forgot to bring the order with her today. Pt is not having any neck pain at the moment and has not since the dry needling to her upper trap a week ago. She is returning to work on Monday and Tuesday next week and then returns to therapy on Wednesday for her reassessment. PT did not perform any skilled PT this date since she was not in any pain, had recently received a shot in her shoulder this morning, and had an increased in  shoulder pain following therex last visit. PT explained that we will wait and see how her neck pain does with her returning to work and sitting all day and will f/u with her on Wednesday at her next appointment. At that time, PT will reassess pt and establish her POC going forward at that point. Pt was agreeable to this plan.     Rehab Potential  Good    PT  Frequency  2x / week    PT Duration  4 weeks    PT Treatment/Interventions  ADLs/Self Care Home Management;Cryotherapy;Electrical Stimulation;Moist Heat;Ultrasound;Functional mobility training;Therapeutic activities;Therapeutic exercise;Balance training;Neuromuscular re-education;Patient/family education;Manual techniques;Scar mobilization;Passive range of motion;Dry needling;Energy conservation;Taping    PT Next Visit Plan  reassess;   continue with manual for soft tissue restrictions; continue and postural strenghtening within UE precautions; continue scalene stretching, continue pec stretching; continue dry needling as needed for pain and soft tissue restrictions    PT Home Exercise Plan  eval: supine retractions  5/14: cervical excursions in sitting; 5/16: upper trap and levator scap stretching    Consulted and Agree with Plan of Care  Patient       Patient will benefit from skilled therapeutic intervention in order to improve the following deficits and impairments:  Decreased activity tolerance, Decreased endurance, Decreased range of motion, Decreased strength, Hypomobility, Increased fascial restricitons, Increased muscle spasms, Impaired UE functional use, Improper body mechanics, Postural dysfunction, Pain  Visit Diagnosis: Cervicalgia  Muscle weakness (generalized)  Other symptoms and signs involving the musculoskeletal system     Problem List Patient Active Problem List   Diagnosis Date Noted  . 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 Reflux esophagitis 04/13/2012  . Esophageal reflux 04/13/2012       Jac Canavan PT, DPT  [facility] [address] [place], [place], [address] Phone: [phone]   Fax:  [phone]  Name: [name] MRN: [id number] Date of Birth: [birth year]

## 2017-11-23 ENCOUNTER — Ambulatory Visit (HOSPITAL_COMMUNITY): Payer: 59 | Attending: Orthopedic Surgery

## 2017-11-23 DIAGNOSIS — G8929 Other chronic pain: Secondary | ICD-10-CM | POA: Diagnosis present

## 2017-11-23 DIAGNOSIS — M25511 Pain in right shoulder: Secondary | ICD-10-CM | POA: Insufficient documentation

## 2017-11-23 DIAGNOSIS — M542 Cervicalgia: Secondary | ICD-10-CM | POA: Diagnosis present

## 2017-11-23 DIAGNOSIS — R29898 Other symptoms and signs involving the musculoskeletal system: Secondary | ICD-10-CM | POA: Diagnosis present

## 2017-11-23 DIAGNOSIS — M6281 Muscle weakness (generalized): Secondary | ICD-10-CM | POA: Insufficient documentation

## 2017-11-23 NOTE — Therapy (Signed)
[place], [place], [address] Phone: [phone]   Fax:  [phone]   Progress Note Reporting Period 10/26/17 to 11/23/17  See note below for Objective Data and Assessment of Progress/Goals.   Physical Therapy Treatment  Patient Details  Name: [name] MRN: [id number] Date of Birth: 22-Feb-1966 Referring Provider: Melina Schools, MD   Encounter Date: 11/23/2017  PT End of Session - 11/23/17 1114    Visit Number  8    Number of Visits  15    Date for PT Re-Evaluation  12/14/17    Authorization Type  [employer] NAP - 100 visits per calendar year and only 4 modalities per day    Authorization Time Period  10/26/17 to 11/23/17; NEW: 11/23/17 to 12/14/17    Authorization - Visit Number  8    Authorization - Number of Visits  100    PT Start Time  1115    PT Stop Time  1158    PT Time Calculation (min)  43 min    Activity Tolerance  Patient tolerated treatment well    Behavior During Therapy  [facility] for tasks assessed/performed       Past Medical History:  Diagnosis Date  . Asthma   . Endometriosis   . Eosinophilic esophagitis   . Feline esophagus   . GERD (gastroesophageal reflux disease)   . HSV-2 (herpes simplex virus 2) infection   . Hyperlipidemia   . Psoriasis   . Ulcerative colitis [facility])     Past Surgical History:  Procedure Laterality Date  . ABDOMINAL HYSTERECTOMY  11/2010  . COLONOSCOPY  04/13/2012   Procedure: COLONOSCOPY;  Surgeon: Lafayette Dragon, MD;  Location: [facility];  Service: Endoscopy;  Laterality: N/A;  . ESOPHAGOGASTRODUODENOSCOPY  04/13/2012   Procedure: ESOPHAGOGASTRODUODENOSCOPY (EGD);  Surgeon: Lafayette Dragon, MD;  Location: [facility];  Service: Endoscopy;  Laterality: N/A;  . LAPAROSCOPY  1990   For endometriosis   . TUBAL LIGATION  1999    There were no vitals filed for this visit.  Subjective Assessment - 11/23/17 1115    Subjective  Pt states that work has been great, she has  not had any issues with her neck. She is going to see if she can get a stand up desk. Her R shoulder is still bothering her so she doesn't think the shot took. She reports it is a dull ache in the joint. No relieving factors or aggravating factors.     Currently in Pain?  Yes    Pain Score  1     Pain Location  Shoulder    Pain Orientation  Right    Pain Descriptors / Indicators  Aching;Dull    Pain Type  Chronic pain    Pain Onset  More than a month ago    Pain Frequency  Constant    Aggravating Factors   none    Pain Relieving Factors  none    Effect of Pain on Daily Activities  mild increase         [facility] PT Assessment - 11/23/17 0001      Assessment   Medical Diagnosis  encounter for other specified surgical aftercare (post-op TDA C5-6); dry needling added for the R shoulder    Referring Provider  Melina Schools, MD    Onset Date/Surgical Date  09/09/17    Next MD Visit  3 months      Other:   Other/ Comments  deep  neck flexor endurance test: 60 sec, no pain      ROM / Strength   AROM / PROM / Strength  AROM;Strength      AROM   AROM Assessment Site  Cervical;Shoulder    Right Shoulder Internal Rotation  57 Degrees in prone    Right Shoulder External Rotation  40 Degrees in prone    Cervical Flexion  52 was 39    Cervical Extension  35 was 33    Cervical - Right Side Bend  30 was 16    Cervical - Left Side Bend  25 was 22    Cervical - Right Rotation  73 was 60    Cervical - Left Rotation  60 was 45      Strength   Right Shoulder Internal Rotation  4/5 pain    Right Shoulder External Rotation  4/5 pain    Cervical - Right Side Bend  4+/5 was 4-    Cervical - Left Side Bend  4+/5 was 4-    Cervical - Right Rotation  4+/5 was 4-    Cervical - Left Rotation  4+/5 was 4-      Palpation   SI assessment   R GH joint mobs, WNL, slightly painful with psterior, inferior, and distraction    Palpation comment  min restrictions throughout bil upper trap, cervical and  thoracic musculature --- pt does have increased restrictions of of R RTC musculature, especially infraspinatus and teres minor was mod-max restrictions      Special Tests    Special Tests  Rotator Cuff Impingement    Rotator Cuff Impingment tests  Neer impingement test;Full Can test;Empty Can test;Drop Arm test;Painful Arc of Motion;Lag signs at 0 degrees;Hawkins- Kennedy test      Neer Impingement test    Findings  Negative    Side  Right      Hawkins-Kennedy test   Findings  Negative    Side  Right      Empty Can test   Findings  Positive    Side  Right      Full Can test   Findings  Negative    Side  Right      Lag time at 0 degrees   Findings  Positive    Side  Right    Comments  no lag, just painful      Drop Arm test   Findings  Negative    Side  Right      Painful Arc of Motion   Findings  Negative    Side  Right    Comments  non-painful, just feels pain on extension             [facility] Adult PT Treatment/Exercise - 11/23/17 0001      Manual Therapy   Manual Therapy  Soft tissue mobilization    Manual therapy comments  completed seperately from all other skilled interventions    Soft tissue mobilization  STM R infraspinatus and teres minor for pain control             PT Education - 11/23/17 1115    Education provided  Yes    Education Details  reassessment findings    Person(s) Educated  Patient    Methods  Explanation    Comprehension  Verbalized understanding       PT Short Term Goals - 11/23/17 1120      PT SHORT TERM GOAL #1   Title  Pt will  be independent wtih HEP and perform consistently in order to decrease pain and improve ROM.    Time  2    Period  Weeks    Status  Achieved      PT SHORT TERM GOAL #2   Title  Pt will have improved cervical AROM throughout by 5 deg in order to maximize her function at home and her ability to drive.    Baseline  6/5: all improved significantly except extension and L LF which improved by 2deg  and 3deg, respectively    Time  2    Period  Weeks    Status  Partially Met      PT SHORT TERM GOAL #3   Title  Pt will score 14/50 or < on the NDI to demo decreased self-perceived disability due to her neck pain.    Baseline  6/5: 0/50    Time  2    Period  Weeks    Status  Achieved      PT SHORT TERM GOAL #4   Title  Pt will have improved R GH IR and ER by 5 deg in order to decrease pain and demo decreased soft tissue restricitons    Time  3    Period  Weeks    Status  New    Target Date  12/14/17      PT SHORT TERM GOAL #5   Title  Pt will have decreased R shoulder pain when picking items up from the floor to demo improved overall function.    Time  3    Period  Weeks    Status  New        PT Long Term Goals - 11/23/17 1121      PT LONG TERM GOAL #1   Title  Pt will have improved cervical AROM throughout by 10 deg or > in order to further maximize driving and allow her to perform functional tasks at home with greater ease.    Baseline  6/5: all improved significantly except extension and L LF which improved by 2deg and 3deg, respectively    Time  4    Period  Weeks    Status  Partially Met      PT LONG TERM GOAL #2   Title  Pt will have 4/5 cervical MMT throughout to decrease pain and improve overall function.     Baseline  6/5: 4+/5 throughout    Status  Achieved      PT LONG TERM GOAL #3   Title  Pt will be able to perform deep neck flexor endurance test for 60 seconds with proper form and without exacerbations in pain to demo improved overall cervical strength and decrease her pain with functional tasks.    Baseline  6/5: 60 sec, no pain    Time  4    Period  Weeks    Status  Achieved      PT LONG TERM GOAL #4   Title  Pt will have decreased soft tissue restrictions throughout cervical, thoracic, and periscapular musculature to min to mod in order to allow for improved ROM and decreased pain.    Baseline  6/5: minimal restrictions throughout    Time  4     Period  Weeks    Status  Achieved            Plan - 11/23/17 1211    Clinical Impression Statement  PT reassessed pt's goals and outcome measures for her  neck pain this date. Pt has made tremendous improvements as illustrated above. Her ROM has significantly improved, her MMT improved, and she was able to work without pain. At this time, pt is [facility] with regards to her neck but she is still complaining of R shoulder pain which has not decreased since her treatment session on 11/16/17. She brought in her referral for dry needling of the R shoulder this date and it was scanned into her chart. Pt only positive for empty can and reported pain with ER lag sign but no lag noted and she also had pain with IR and ER MMT. Shoulder AROM/PROM all WNL and non-painful. Pt did have increased soft tissue restrictions of R RTC musculature, especially the infraspinatus and teres minor, and palpation to this musculature recreated her same pain. Ended session with STM to R infraspinatus and teres minor for pain control; noted palpable knot in this musculature and will attempt dry needling next visit. Sessions going forward will begin to address her R shoulder pain/RTC pathology while addressing her cervical pain only as needed.     Rehab Potential  Good    PT Frequency  2x / week    PT Duration  4 weeks    PT Treatment/Interventions  ADLs/Self Care Home Management;Cryotherapy;Electrical Stimulation;Moist Heat;Ultrasound;Functional mobility training;Therapeutic activities;Therapeutic exercise;Balance training;Neuromuscular re-education;Patient/family education;Manual techniques;Scar mobilization;Passive range of motion;Dry needling;Energy conservation;Taping    PT Next Visit Plan  dry needling R RTC, mainly infraspinatus and teres minor; subscap strengthening to assist with shoulder pain and function    PT Home Exercise Plan  eval: supine retractions  5/14: cervical excursions in sitting; 5/16: upper trap and levator scap  stretching    Consulted and Agree with Plan of Care  Patient       Patient will benefit from skilled therapeutic intervention in order to improve the following deficits and impairments:  Decreased activity tolerance, Decreased endurance, Decreased range of motion, Decreased strength, Hypomobility, Increased fascial restricitons, Increased muscle spasms, Impaired UE functional use, Improper body mechanics, Postural dysfunction, Pain  Visit Diagnosis: Chronic right shoulder pain - Plan: PT plan of care cert/re-cert  Cervicalgia - Plan: PT plan of care cert/re-cert  Muscle weakness (generalized) - Plan: PT plan of care cert/re-cert  Other symptoms and signs involving the musculoskeletal system - Plan: PT plan of care cert/re-cert     Problem List Patient Active Problem List   Diagnosis Date Noted  . 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 Reflux esophagitis 04/13/2012  . Esophageal reflux 04/13/2012       Geraldine Solar PT, DPT  [place] [address] [place], [place], [address] Phone: [phone]   Fax:  [phone]  Name: CHAMAINE STANKUS MRN: [id number] Date of Birth: 08-29-1965

## 2017-11-25 ENCOUNTER — Ambulatory Visit (HOSPITAL_COMMUNITY): Payer: 59

## 2017-11-25 DIAGNOSIS — M25511 Pain in right shoulder: Principal | ICD-10-CM

## 2017-11-25 DIAGNOSIS — M542 Cervicalgia: Secondary | ICD-10-CM

## 2017-11-25 DIAGNOSIS — R29898 Other symptoms and signs involving the musculoskeletal system: Secondary | ICD-10-CM

## 2017-11-25 DIAGNOSIS — M6281 Muscle weakness (generalized): Secondary | ICD-10-CM

## 2017-11-25 DIAGNOSIS — G8929 Other chronic pain: Secondary | ICD-10-CM

## 2017-11-25 NOTE — Therapy (Signed)
[place], [place], [address] Phone: [phone]   Fax:  [phone]  Physical Therapy Treatment  Patient Details  Name: TAYTEM GHATTAS MRN: [id number] Date of Birth: Oct 04, 1965 Referring Provider: Melina Schools, MD   Encounter Date: 11/25/2017  PT End of Session - 11/25/17 1114    Visit Number  9    Number of Visits  15    Date for PT Re-Evaluation  12/14/17    Authorization Type  [employer] NAP - 100 visits per calendar year and only 4 modalities per day    Authorization Time Period  10/26/17 to 11/23/17; NEW: 11/23/17 to 12/14/17    Authorization - Visit Number  9    Authorization - Number of Visits  100    PT Start Time  1115    PT Stop Time  1154    PT Time Calculation (min)  39 min    Activity Tolerance  Patient tolerated treatment well    Behavior During Therapy  [facility] for tasks assessed/performed       Past Medical History:  Diagnosis Date  . Asthma   . Endometriosis   . Eosinophilic esophagitis   . Feline esophagus   . GERD (gastroesophageal reflux disease)   . HSV-2 (herpes simplex virus 2) infection   . Hyperlipidemia   . Psoriasis   . Ulcerative colitis [facility])     Past Surgical History:  Procedure Laterality Date  . ABDOMINAL HYSTERECTOMY  11/2010  . COLONOSCOPY  04/13/2012   Procedure: COLONOSCOPY;  Surgeon: Lafayette Dragon, MD;  Location: [facility];  Service: Endoscopy;  Laterality: N/A;  . ESOPHAGOGASTRODUODENOSCOPY  04/13/2012   Procedure: ESOPHAGOGASTRODUODENOSCOPY (EGD);  Surgeon: Lafayette Dragon, MD;  Location: [facility];  Service: Endoscopy;  Laterality: N/A;  . LAPAROSCOPY  1990   For endometriosis   . TUBAL LIGATION  1999    There were no vitals filed for this visit.  Subjective Assessment - 11/25/17 1115    Subjective  Pt states that yesterday was rough. She has a bruise on her shoulder. But overall, she states that her shoulder is feeling better after the manual.     Currently in  Pain?  No/denies    Pain Onset  More than a month ago           [facility] Adult PT Treatment/Exercise - 11/25/17 0001      Neck Exercises: Theraband   Shoulder External Rotation  10 reps;Red;Limitations    Shoulder External Rotation Limitations  2 set, towel roll, RUE    Shoulder Internal Rotation  10 reps;Red;Limitations    Shoulder Internal Rotation Limitations  2 sets, towel roll, RUE      Manual Therapy   Manual Therapy  Soft tissue mobilization    Manual therapy comments  completed seperately from all other skilled interventions    Soft tissue mobilization  STM R infraspinatus and teres minor/major/lats for pain control following needling      Neck Exercises: Stretches   Other Neck Stretches  posterior capsule stretch, IR stretch with towel, IR stretch in doorway, 2x30" each       Trigger Point Dry Needling - 11/25/17 1119    Consent Given?  Yes    Education Handout Provided  No    Muscles Treated Upper Body  Infraspinatus;Rhomboids    Rhomboids Response  Twitch response elicited;Palpable increased muscle length Teres Major    Infraspinatus Response  Twitch response elicited;Palpable increased muscle length  PT Education - 11/25/17 1114    Education provided  Yes    Education Details  muscle soreness following dry needling, continue HEP    Person(s) Educated  Patient    Methods  Explanation;Demonstration    Comprehension  Verbalized understanding;Returned demonstration       PT Short Term Goals - 11/23/17 1120      PT SHORT TERM GOAL #1   Title  Pt will be independent wtih HEP and perform consistently in order to decrease pain and improve ROM.    Time  2    Period  Weeks    Status  Achieved      PT SHORT TERM GOAL #2   Title  Pt will have improved cervical AROM throughout by 5 deg in order to maximize her function at home and her ability to drive.    Baseline  6/5: all improved significantly except extension and L LF which improved by 2deg and  3deg, respectively    Time  2    Period  Weeks    Status  Partially Met      PT SHORT TERM GOAL #3   Title  Pt will score 14/50 or < on the NDI to demo decreased self-perceived disability due to her neck pain.    Baseline  6/5: 0/50    Time  2    Period  Weeks    Status  Achieved      PT SHORT TERM GOAL #4   Title  Pt will have improved R GH IR and ER by 5 deg in order to decrease pain and demo decreased soft tissue restricitons    Time  3    Period  Weeks    Status  New    Target Date  12/14/17      PT SHORT TERM GOAL #5   Title  Pt will have decreased R shoulder pain when picking items up from the floor to demo improved overall function.    Time  3    Period  Weeks    Status  New        PT Long Term Goals - 11/23/17 1121      PT LONG TERM GOAL #1   Title  Pt will have improved cervical AROM throughout by 10 deg or > in order to further maximize driving and allow her to perform functional tasks at home with greater ease.    Baseline  6/5: all improved significantly except extension and L LF which improved by 2deg and 3deg, respectively    Time  4    Period  Weeks    Status  Partially Met      PT LONG TERM GOAL #2   Title  Pt will have 4/5 cervical MMT throughout to decrease pain and improve overall function.     Baseline  6/5: 4+/5 throughout    Status  Achieved      PT LONG TERM GOAL #3   Title  Pt will be able to perform deep neck flexor endurance test for 60 seconds with proper form and without exacerbations in pain to demo improved overall cervical strength and decrease her pain with functional tasks.    Baseline  6/5: 60 sec, no pain    Time  4    Period  Weeks    Status  Achieved      PT LONG TERM GOAL #4   Title  Pt will have decreased soft tissue restrictions throughout cervical, thoracic, and  periscapular musculature to min to mod in order to allow for improved ROM and decreased pain.    Baseline  6/5: minimal restrictions throughout    Time  4    Period   Weeks    Status  Achieved            Plan - 11/25/17 1206    Clinical Impression Statement  Pt presents to therapy stating that the manual therapy bruised her arm but that overall, her shoulder does feel better following the manual. Pt agreeable to trigger point dry needling again this date to address soft tissue restrictions in RTC and surrounding musculature. Mainly targeted pt's R infraspinatus as well as her teres major since she had pain with both active ER and IR; good twitch responses elicited throughout. Pt reporting recreation of deep GH joint pain with needling to infraspinatus. Followed needling with STM to the RTC, teres major and lats for continued pain control. Ended session with muscle stretching and muscle activation of musculature needled. Pt reported resolution of deep ache feeling and only reporting pain with active movement of her R shoulder. Will assess subscapularis next visit and potentially needle it to further decrease pain and improve function    Rehab Potential  Good    PT Frequency  2x / week    PT Duration  4 weeks    PT Treatment/Interventions  ADLs/Self Care Home Management;Cryotherapy;Electrical Stimulation;Moist Heat;Ultrasound;Functional mobility training;Therapeutic activities;Therapeutic exercise;Balance training;Neuromuscular re-education;Patient/family education;Manual techniques;Scar mobilization;Passive range of motion;Dry needling;Energy conservation;Taping    PT Next Visit Plan  dry needling R RTC, mainly infraspinatus and teres minor; assess subscap involvement and any soft tissue restrictions; serratus strengthening to assist with shoulder pain and function    PT Home Exercise Plan  eval: supine retractions  5/14: cervical excursions in sitting; 5/16: upper trap and levator scap stretching    Consulted and Agree with Plan of Care  Patient       Patient will benefit from skilled therapeutic intervention in order to improve the following deficits and  impairments:  Decreased activity tolerance, Decreased endurance, Decreased range of motion, Decreased strength, Hypomobility, Increased fascial restricitons, Increased muscle spasms, Impaired UE functional use, Improper body mechanics, Postural dysfunction, Pain  Visit Diagnosis: Chronic right shoulder pain  Cervicalgia  Muscle weakness (generalized)  Other symptoms and signs involving the musculoskeletal system     Problem List Patient Active Problem List   Diagnosis Date Noted  . 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 Reflux esophagitis 04/13/2012  . Esophageal reflux 04/13/2012       Geraldine Solar PT, DPT  [facility] [address] [place], [place], [address] Phone: [phone]   Fax:  [phone]  Name: ALLSION NOGALES MRN: [id number] Date of Birth: [birth year]

## 2017-11-30 ENCOUNTER — Ambulatory Visit (HOSPITAL_COMMUNITY): Payer: 59

## 2017-11-30 DIAGNOSIS — M542 Cervicalgia: Secondary | ICD-10-CM

## 2017-11-30 DIAGNOSIS — M6281 Muscle weakness (generalized): Secondary | ICD-10-CM

## 2017-11-30 DIAGNOSIS — M25511 Pain in right shoulder: Principal | ICD-10-CM

## 2017-11-30 DIAGNOSIS — R29898 Other symptoms and signs involving the musculoskeletal system: Secondary | ICD-10-CM

## 2017-11-30 DIAGNOSIS — G8929 Other chronic pain: Secondary | ICD-10-CM

## 2017-11-30 NOTE — Therapy (Signed)
[place], [place], [address] Phone: [phone]   Fax:  [phone]  Physical Therapy Treatment  Patient Details  Name: [name] MRN: [id number] Date of Birth: 1965/07/02 Referring Provider: Melina Schools, MD   Encounter Date: 11/30/2017  PT End of Session - 11/30/17 1259    Visit Number  10    Number of Visits  15    Date for PT Re-Evaluation  12/14/17    Authorization Type  [employer] N[employer] - 100 visits per calendar year and only 4 modalities per day    Authorization Time Period  10/26/17 to 11/23/17; NEW: 11/23/17 to 12/14/17    Authorization - Visit Number  10    Authorization - Number of Visits  100    PT Start Time  1300    PT Stop Time  1339    PT Time Calculation (min)  39 min    Activity Tolerance  Patient tolerated treatment well    Behavior During Therapy  [facility] for tasks assessed/performed       Past Medical History:  Diagnosis Date  . Asthma   . Endometriosis   . Eosinophilic esophagitis   . Feline esophagus   . GERD (gastroesophageal reflux disease)   . HSV-2 (herpes simplex virus 2) infection   . Hyperlipidemia   . Psoriasis   . Ulcerative colitis [facility])     Past Surgical History:  Procedure Laterality Date  . ABDOMINAL HYSTERECTOMY  11/2010  . COLONOSCOPY  04/13/2012   Procedure: COLONOSCOPY;  Surgeon: Lafayette Dragon, MD;  Location: [facility];  Service: Endoscopy;  Laterality: N/A;  . ESOPHAGOGASTRODUODENOSCOPY  04/13/2012   Procedure: ESOPHAGOGASTRODUODENOSCOPY (EGD);  Surgeon: Lafayette Dragon, MD;  Location: [facility];  Service: Endoscopy;  Laterality: N/A;  . LAPAROSCOPY  1990   For endometriosis   . TUBAL LIGATION  1999    There were no vitals filed for this visit.  Subjective Assessment - 11/30/17 1301    Subjective  Pt states that she is still having some R shoulder pain but overall it is much better. The dull pain has decreased inside and it is less painful with movement.    Currently in Pain?  No/denies    Pain Onset  More than a month ago              [place] Adult PT Treatment/Exercise - 11/30/17 0001      Neck Exercises: Seated   Money  20 reps    Shoulder Rolls  Backwards;15 reps      Manual Therapy   Manual Therapy  Soft tissue mobilization    Manual therapy comments  completed seperately from all other skilled interventions    Soft tissue mobilization  STM R infraspinatus and teres minor/major/lats for pain control following needling      Neck Exercises: Stretches   Other Neck Stretches  pec stretch in doorway 2x30"    Other Neck Stretches  posterior capsule stretch 1x30"       Trigger Point Dry Needling - 11/30/17 1303    Consent Given?  Yes    Education Handout Provided  No    Muscles Treated Upper Body  Infraspinatus;Rhomboids    Rhomboids Response  Twitch response elicited;Palpable increased muscle length teres minor and subscap    Infraspinatus Response  Twitch response elicited;Palpable increased muscle length           PT Education - 11/30/17 1259    Education provided  Yes    Education Details  dry needling soreness, continue HEP    Person(s) Educated  Patient    Methods  Explanation;Demonstration    Comprehension  Verbalized understanding;Returned demonstration       PT Short Term Goals - 11/23/17 1120      PT SHORT TERM GOAL #1   Title  Pt will be independent wtih HEP and perform consistently in order to decrease pain and improve ROM.    Time  2    Period  Weeks    Status  Achieved      PT SHORT TERM GOAL #2   Title  Pt will have improved cervical AROM throughout by 5 deg in order to maximize her function at home and her ability to drive.    Baseline  6/5: all improved significantly except extension and L LF which improved by 2deg and 3deg, respectively    Time  2    Period  Weeks    Status  Partially Met      PT SHORT TERM GOAL #3   Title  Pt will score 14/50 or < on the NDI to demo decreased  self-perceived disability due to her neck pain.    Baseline  6/5: 0/50    Time  2    Period  Weeks    Status  Achieved      PT SHORT TERM GOAL #4   Title  Pt will have improved R GH IR and ER by 5 deg in order to decrease pain and demo decreased soft tissue restricitons    Time  3    Period  Weeks    Status  New    Target Date  12/14/17      PT SHORT TERM GOAL #5   Title  Pt will have decreased R shoulder pain when picking items up from the floor to demo improved overall function.    Time  3    Period  Weeks    Status  New        PT Long Term Goals - 11/23/17 1121      PT LONG TERM GOAL #1   Title  Pt will have improved cervical AROM throughout by 10 deg or > in order to further maximize driving and allow her to perform functional tasks at home with greater ease.    Baseline  6/5: all improved significantly except extension and L LF which improved by 2deg and 3deg, respectively    Time  4    Period  Weeks    Status  Partially Met      PT LONG TERM GOAL #2   Title  Pt will have 4/5 cervical MMT throughout to decrease pain and improve overall function.     Baseline  6/5: 4+/5 throughout    Status  Achieved      PT LONG TERM GOAL #3   Title  Pt will be able to perform deep neck flexor endurance test for 60 seconds with proper form and without exacerbations in pain to demo improved overall cervical strength and decrease her pain with functional tasks.    Baseline  6/5: 60 sec, no pain    Time  4    Period  Weeks    Status  Achieved      PT LONG TERM GOAL #4   Title  Pt will have decreased soft tissue restrictions throughout cervical, thoracic, and periscapular musculature to min to mod in order to allow for improved ROM  and decreased pain.    Baseline  6/5: minimal restrictions throughout    Time  4    Period  Weeks    Status  Achieved            Plan - 11/30/17 1346    Clinical Impression Statement  Pt making steady progress towards goals reporting decreasing  shoulder pain following last bout of needling. Resumed needling this date and PT able to get great twitch responses out of pt's infraspinatus and teres minor. Pt reporting recreation of dull achey pain into [facility] joint and referred pain down lateral arm during needling, indicating these were her active trigger points. Followed with light STM to infraspinatus, teres minor, teres major/lats following needling to further decrease pain and promote relaxation. Ended with gentle stretching and light muscle activation at EOS. Pt reporting increased shoulder achiness and shoulder fatigue but PT educating her that this is normal response and she verbalized understanding.     Rehab Potential  Good    PT Frequency  2x / week    PT Duration  4 weeks    PT Treatment/Interventions  ADLs/Self Care Home Management;Cryotherapy;Electrical Stimulation;Moist Heat;Ultrasound;Functional mobility training;Therapeutic activities;Therapeutic exercise;Balance training;Neuromuscular re-education;Patient/family education;Manual techniques;Scar mobilization;Passive range of motion;Dry needling;Energy conservation;Taping    PT Next Visit Plan  dry needling R RTC, mainly infraspinatus and teres minor; assess subscap involvement and any soft tissue restrictions; serratus strengthening to assist with shoulder pain and function; manual pec minor release    PT Home Exercise Plan  eval: supine retractions  5/14: cervical excursions in sitting; 5/16: upper trap and levator scap stretching    Consulted and Agree with Plan of Care  Patient       Patient will benefit from skilled therapeutic intervention in order to improve the following deficits and impairments:  Decreased activity tolerance, Decreased endurance, Decreased range of motion, Decreased strength, Hypomobility, Increased fascial restricitons, Increased muscle spasms, Impaired UE functional use, Improper body mechanics, Postural dysfunction, Pain  Visit Diagnosis: Chronic right  shoulder pain  Cervicalgia  Muscle weakness (generalized)  Other symptoms and signs involving the musculoskeletal system     Problem List Patient Active Problem List   Diagnosis Date Noted  . 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 Reflux esophagitis 04/13/2012  . Esophageal reflux 04/13/2012     Geraldine Solar PT, DPT   [place] [address] [place], [place], [address] Phone: [phone]   Fax:  [phone]  Name: LENDORA KEYS MRN: [id number] Date of Birth: Nov 05, 1965

## 2017-12-02 ENCOUNTER — Ambulatory Visit (HOSPITAL_COMMUNITY): Payer: 59

## 2017-12-02 DIAGNOSIS — M25511 Pain in right shoulder: Secondary | ICD-10-CM | POA: Diagnosis not present

## 2017-12-02 DIAGNOSIS — G8929 Other chronic pain: Secondary | ICD-10-CM

## 2017-12-02 DIAGNOSIS — M542 Cervicalgia: Secondary | ICD-10-CM

## 2017-12-02 DIAGNOSIS — R29898 Other symptoms and signs involving the musculoskeletal system: Secondary | ICD-10-CM

## 2017-12-02 DIAGNOSIS — M6281 Muscle weakness (generalized): Secondary | ICD-10-CM

## 2017-12-02 NOTE — Therapy (Signed)
[place], [place], [address] Phone: [phone]   Fax:  [phone]  Physical Therapy Treatment  Patient Details  Name: [name] MRN: [id number] Date of Birth: Dec 10, 1965 Referring Provider: Melina Schools, MD   Encounter Date: 12/02/2017  PT End of Session - 12/02/17 1254    Visit Number  11    Number of Visits  15    Date for PT Re-Evaluation  12/14/17    Authorization Type  [employer] NAP - 100 visits per calendar year and only 4 modalities per day    Authorization Time Period  10/26/17 to 11/23/17; NEW: 11/23/17 to 12/14/17    Authorization - Visit Number  11    Authorization - Number of Visits  100    PT Start Time  1300    PT Stop Time  1343    PT Time Calculation (min)  43 min    Activity Tolerance  Patient tolerated treatment well    Behavior During Therapy  [facility] for tasks assessed/performed       Past Medical History:  Diagnosis Date  . Asthma   . Endometriosis   . Eosinophilic esophagitis   . Feline esophagus   . GERD (gastroesophageal reflux disease)   . HSV-2 (herpes simplex virus 2) infection   . Hyperlipidemia   . Psoriasis   . Ulcerative colitis [facility])     Past Surgical History:  Procedure Laterality Date  . ABDOMINAL HYSTERECTOMY  11/2010  . COLONOSCOPY  04/13/2012   Procedure: COLONOSCOPY;  Surgeon: Lafayette Dragon, MD;  Location: [facility];  Service: Endoscopy;  Laterality: N/A;  . ESOPHAGOGASTRODUODENOSCOPY  04/13/2012   Procedure: ESOPHAGOGASTRODUODENOSCOPY (EGD);  Surgeon: Lafayette Dragon, MD;  Location: [facility];  Service: Endoscopy;  Laterality: N/A;  . LAPAROSCOPY  1990   For endometriosis   . TUBAL LIGATION  1999    There were no vitals filed for this visit.  Subjective Assessment - 12/02/17 1259    Subjective  Pt reports that she was so sore Wednesday evening from the needling; she could hardly lift her car keys. But, yesterday and today have been fine. No pain and no ache.     Currently in Pain?  No/denies    Pain Onset  More than a month ago          [facility] Adult PT Treatment/Exercise - 12/02/17 0001      Neck Exercises: Machines for Strengthening   UBE (Upper Arm Bike)  x3 mins, retro, L3, postural strengthening      Neck Exercises: Theraband   Shoulder External Rotation  15 reps;Red    Shoulder External Rotation Limitations  2 set, towel roll, RUE; +1 set x10 reps with GTB    Shoulder Internal Rotation  15 reps;Red;Limitations    Shoulder Internal Rotation Limitations  2 sets, towel roll, RUE; +1 set x15 reps with GTB      Neck Exercises: Standing   Wall Wash  2#, 2x10 reps; cues to stabilize scapula    Thumb Tacks  RUE body blade horiz and vert perturbations 5x10" each (elbow bent)    Other Standing Exercises  RUE scaption 2x15reps with 2# dumbbell    Other Standing Exercises  Y's on wall with liftoff 2x10reps      Neck Exercises: Supine   Other Supine Exercise  serratus punches 2# x15reps      Neck Exercises: Prone   Other Prone Exercise  RUE I, Y, T x10 reps  each      Manual Therapy   Manual Therapy  Soft tissue mobilization    Manual therapy comments  completed seperately from all other skilled interventions    Soft tissue mobilization  STM R infraspinatus and teres minor/major/lats for pain control and decreasing restrictions      Neck Exercises: Stretches   Other Neck Stretches  pec stretch in doorway 3x30"    Other Neck Stretches  posterior capsule stretch 3x30"           PT Education - 12/02/17 1302    Education provided  Yes    Education Details  exercise technique, continue HEP    Person(s) Educated  Patient    Methods  Explanation;Demonstration    Comprehension  Verbalized understanding;Returned demonstration       PT Short Term Goals - 11/23/17 1120      PT SHORT TERM GOAL #1   Title  Pt will be independent wtih HEP and perform consistently in order to decrease pain and improve ROM.    Time  2    Period  Weeks     Status  Achieved      PT SHORT TERM GOAL #2   Title  Pt will have improved cervical AROM throughout by 5 deg in order to maximize her function at home and her ability to drive.    Baseline  6/5: all improved significantly except extension and L LF which improved by 2deg and 3deg, respectively    Time  2    Period  Weeks    Status  Partially Met      PT SHORT TERM GOAL #3   Title  Pt will score 14/50 or < on the NDI to demo decreased self-perceived disability due to her neck pain.    Baseline  6/5: 0/50    Time  2    Period  Weeks    Status  Achieved      PT SHORT TERM GOAL #4   Title  Pt will have improved R GH IR and ER by 5 deg in order to decrease pain and demo decreased soft tissue restricitons    Time  3    Period  Weeks    Status  New    Target Date  12/14/17      PT SHORT TERM GOAL #5   Title  Pt will have decreased R shoulder pain when picking items up from the floor to demo improved overall function.    Time  3    Period  Weeks    Status  New        PT Long Term Goals - 11/23/17 1121      PT LONG TERM GOAL #1   Title  Pt will have improved cervical AROM throughout by 10 deg or > in order to further maximize driving and allow her to perform functional tasks at home with greater ease.    Baseline  6/5: all improved significantly except extension and L LF which improved by 2deg and 3deg, respectively    Time  4    Period  Weeks    Status  Partially Met      PT LONG TERM GOAL #2   Title  Pt will have 4/5 cervical MMT throughout to decrease pain and improve overall function.     Baseline  6/5: 4+/5 throughout    Status  Achieved      PT LONG TERM GOAL #3   Title  Pt will  be able to perform deep neck flexor endurance test for 60 seconds with proper form and without exacerbations in pain to demo improved overall cervical strength and decrease her pain with functional tasks.    Baseline  6/5: 60 sec, no pain    Time  4    Period  Weeks    Status  Achieved      PT  LONG TERM GOAL #4   Title  Pt will have decreased soft tissue restrictions throughout cervical, thoracic, and periscapular musculature to min to mod in order to allow for improved ROM and decreased pain.    Baseline  6/5: minimal restrictions throughout    Time  4    Period  Weeks    Status  Achieved            Plan - 12/02/17 1343    Clinical Impression Statement  Pt reporting much improved R shoulder symptoms following the dry needling last visit. Today's session focused on RTC strengthening, postural strengthening, and manual to address remaining soft tissue restrictions. Pt tolerating session well, but did demo muscular fatigue AEB difficulty with exercises and verbalizing muscle fatigue. Ended with STM to R RTC musculature to further decrease soft tissue restrictions; most notably, pt had the most pain and tenderness in her teres major/lats. Will address these with needling at her next appointment since she has had such positive responses to needling. Pt reporting feeling good at EOS. Continue as planned, progressing as able.     Rehab Potential  Good    PT Frequency  2x / week    PT Duration  4 weeks    PT Treatment/Interventions  ADLs/Self Care Home Management;Cryotherapy;Electrical Stimulation;Moist Heat;Ultrasound;Functional mobility training;Therapeutic activities;Therapeutic exercise;Balance training;Neuromuscular re-education;Patient/family education;Manual techniques;Scar mobilization;Passive range of motion;Dry needling;Energy conservation;Taping    PT Next Visit Plan  dry needling R RTC (infraspinatus as needed) and address teres major/lats with needling; serratus strengthening to assist with shoulder pain and function; manual pec minor release    PT Home Exercise Plan  eval: supine retractions  5/14: cervical excursions in sitting; 5/16: upper trap and levator scap stretching    Consulted and Agree with Plan of Care  Patient       Patient will benefit from skilled  therapeutic intervention in order to improve the following deficits and impairments:  Decreased activity tolerance, Decreased endurance, Decreased range of motion, Decreased strength, Hypomobility, Increased fascial restricitons, Increased muscle spasms, Impaired UE functional use, Improper body mechanics, Postural dysfunction, Pain  Visit Diagnosis: Chronic right shoulder pain  Cervicalgia  Muscle weakness (generalized)  Other symptoms and signs involving the musculoskeletal system     Problem List Patient Active Problem List   Diagnosis Date Noted  . 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 Reflux esophagitis 04/13/2012  . Esophageal reflux 04/13/2012        Geraldine Solar PT, DPT  [place] [address] [place], [place], [address] Phone: [phone]   Fax:  [phone]  Name: [name] MRN: [id number] Date of Birth: [birth year]

## 2017-12-07 ENCOUNTER — Ambulatory Visit (HOSPITAL_COMMUNITY): Payer: 59

## 2017-12-07 DIAGNOSIS — M6281 Muscle weakness (generalized): Secondary | ICD-10-CM

## 2017-12-07 DIAGNOSIS — R29898 Other symptoms and signs involving the musculoskeletal system: Secondary | ICD-10-CM

## 2017-12-07 DIAGNOSIS — M542 Cervicalgia: Secondary | ICD-10-CM

## 2017-12-07 DIAGNOSIS — M25511 Pain in right shoulder: Principal | ICD-10-CM

## 2017-12-07 DIAGNOSIS — G8929 Other chronic pain: Secondary | ICD-10-CM

## 2017-12-07 NOTE — Therapy (Signed)
[place], [place], [address] Phone: [phone]   Fax:  [phone]  Physical Therapy Treatment  Patient Details  Name: [name] MRN: [id number] Date of Birth: Oct 22, 1965 Referring Provider: Melina Schools, MD   Encounter Date: 12/07/2017  PT End of Session - 12/07/17 0816    Visit Number  12    Number of Visits  15    Date for PT Re-Evaluation  12/14/17    Authorization Type  [employer] NAP - 100 visits per calendar year and only 4 modalities per day    Authorization Time Period  10/26/17 to 11/23/17; NEW: 11/23/17 to 12/14/17    Authorization - Visit Number  12    Authorization - Number of Visits  100    PT Start Time  0815    PT Stop Time  9678    PT Time Calculation (min)  40 min    Activity Tolerance  Patient tolerated treatment well    Behavior During Therapy  [facility] for tasks assessed/performed       Past Medical History:  Diagnosis Date  . Asthma   . Endometriosis   . Eosinophilic esophagitis   . Feline esophagus   . GERD (gastroesophageal reflux disease)   . HSV-2 (herpes simplex virus 2) infection   . Hyperlipidemia   . Psoriasis   . Ulcerative colitis [facility])     Past Surgical History:  Procedure Laterality Date  . ABDOMINAL HYSTERECTOMY  11/2010  . COLONOSCOPY  04/13/2012   Procedure: COLONOSCOPY;  Surgeon: Lafayette Dragon, MD;  Location: [facility];  Service: Endoscopy;  Laterality: N/A;  . ESOPHAGOGASTRODUODENOSCOPY  04/13/2012   Procedure: ESOPHAGOGASTRODUODENOSCOPY (EGD);  Surgeon: Lafayette Dragon, MD;  Location: [facility];  Service: Endoscopy;  Laterality: N/A;  . LAPAROSCOPY  1990   For endometriosis   . TUBAL LIGATION  1999    There were no vitals filed for this visit.  Subjective Assessment - 12/07/17 0817    Subjective  Pt staets that she aggravated her sciatic nerve mowing the grass Friday but states that her shoulder is hanging in there. No pain, just some muscle soreness     Currently in Pain?  No/denies    Pain Onset  More than a month ago           [facility] Adult PT Treatment/Exercise - 12/07/17 0001      Neck Exercises: Standing   Lift / Chop  --    Left / Chop Limitations  money GTB x20 reps      Neck Exercises: Prone   Other Prone Exercise  R GH IR and ER x20 reps      Trigger Point Dry Needling - 12/07/17 0818    Consent Given?  Yes    Education Handout Provided  No    Muscles Treated Upper Body  Rhomboids;Supraspinatus;Infraspinatus;Subscapularis    Rhomboids Response  Twitch response elicited;Palpable increased muscle length teres major and lats    Supraspinatus Response  Twitch response elicited;Palpable increased muscle length    Infraspinatus Response  Twitch response elicited;Palpable increased muscle length    Subscapularis Response  Twitch response elicited;Palpable increased muscle length in supine           PT Short Term Goals - 11/23/17 1120      PT SHORT TERM GOAL #1   Title  Pt will be independent wtih HEP and perform consistently in order to decrease pain and improve ROM.    Time  2    Period  Weeks    Status  Achieved      PT SHORT TERM GOAL #2   Title  Pt will have improved cervical AROM throughout by 5 deg in order to maximize her function at home and her ability to drive.    Baseline  6/5: all improved significantly except extension and L LF which improved by 2deg and 3deg, respectively    Time  2    Period  Weeks    Status  Partially Met      PT SHORT TERM GOAL #3   Title  Pt will score 14/50 or < on the NDI to demo decreased self-perceived disability due to her neck pain.    Baseline  6/5: 0/50    Time  2    Period  Weeks    Status  Achieved      PT SHORT TERM GOAL #4   Title  Pt will have improved R GH IR and ER by 5 deg in order to decrease pain and demo decreased soft tissue restricitons    Time  3    Period  Weeks    Status  New    Target Date  12/14/17      PT SHORT TERM GOAL #5   Title  Pt will  have decreased R shoulder pain when picking items up from the floor to demo improved overall function.    Time  3    Period  Weeks    Status  New        PT Long Term Goals - 11/23/17 1121      PT LONG TERM GOAL #1   Title  Pt will have improved cervical AROM throughout by 10 deg or > in order to further maximize driving and allow her to perform functional tasks at home with greater ease.    Baseline  6/5: all improved significantly except extension and L LF which improved by 2deg and 3deg, respectively    Time  4    Period  Weeks    Status  Partially Met      PT LONG TERM GOAL #2   Title  Pt will have 4/5 cervical MMT throughout to decrease pain and improve overall function.     Baseline  6/5: 4+/5 throughout    Status  Achieved      PT LONG TERM GOAL #3   Title  Pt will be able to perform deep neck flexor endurance test for 60 seconds with proper form and without exacerbations in pain to demo improved overall cervical strength and decrease her pain with functional tasks.    Baseline  6/5: 60 sec, no pain    Time  4    Period  Weeks    Status  Achieved      PT LONG TERM GOAL #4   Title  Pt will have decreased soft tissue restrictions throughout cervical, thoracic, and periscapular musculature to min to mod in order to allow for improved ROM and decreased pain.    Baseline  6/5: minimal restrictions throughout    Time  4    Period  Weeks    Status  Achieved            Plan - 12/07/17 0856    Clinical Impression Statement  Resumed needling this date to continue to address R shoulder pain and soft tissue restrictions. Pt making notable progress as her soft tissue restrictions have really decreased and she is  less tender to palpation throughout RTC musculature. Followed needling with massage and muscle activation in order to further decrease pain. Updated HEP to include RTC strengthening with GTB, pt declined written handout of exercises. Continue as planned, progressing as  able.     Rehab Potential  Good    PT Frequency  2x / week    PT Duration  4 weeks    PT Treatment/Interventions  ADLs/Self Care Home Management;Cryotherapy;Electrical Stimulation;Moist Heat;Ultrasound;Functional mobility training;Therapeutic activities;Therapeutic exercise;Balance training;Neuromuscular re-education;Patient/family education;Manual techniques;Scar mobilization;Passive range of motion;Dry needling;Energy conservation;Taping    PT Next Visit Plan  dry needling R RTC (infraspinatus as needed) and address teres major/lats with needling; serratus strengthening to assist with shoulder pain and function; continue manual pec minor release    PT Home Exercise Plan  eval: supine retractions  5/14: cervical excursions in sitting; 5/16: upper trap and levator scap stretching; 6/19: standing  GH IR/ER  with GTB, money with GTB    Consulted and Agree with Plan of Care  Patient       Patient will benefit from skilled therapeutic intervention in order to improve the following deficits and impairments:  Decreased activity tolerance, Decreased endurance, Decreased range of motion, Decreased strength, Hypomobility, Increased fascial restricitons, Increased muscle spasms, Impaired UE functional use, Improper body mechanics, Postural dysfunction, Pain  Visit Diagnosis: Chronic right shoulder pain  Cervicalgia  Muscle weakness (generalized)  Other symptoms and signs involving the musculoskeletal system     Problem List Patient Active Problem List   Diagnosis Date Noted  . 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 Reflux esophagitis 04/13/2012  . Esophageal reflux 04/13/2012       Geraldine Solar PT, DPT  [place] [address] [place], [place], [address] Phone: [phone]   Fax:  [phone]  Name: MARKERIA GOETSCH MRN: [id number] Date of Birth: [birth year]

## 2017-12-09 ENCOUNTER — Ambulatory Visit (HOSPITAL_COMMUNITY): Payer: 59

## 2017-12-09 ENCOUNTER — Encounter (HOSPITAL_COMMUNITY): Payer: Self-pay

## 2017-12-09 DIAGNOSIS — M25511 Pain in right shoulder: Principal | ICD-10-CM

## 2017-12-09 DIAGNOSIS — R29898 Other symptoms and signs involving the musculoskeletal system: Secondary | ICD-10-CM

## 2017-12-09 DIAGNOSIS — G8929 Other chronic pain: Secondary | ICD-10-CM

## 2017-12-09 DIAGNOSIS — M6281 Muscle weakness (generalized): Secondary | ICD-10-CM

## 2017-12-09 DIAGNOSIS — M542 Cervicalgia: Secondary | ICD-10-CM

## 2017-12-09 NOTE — Therapy (Signed)
[place], [place], [address] Phone: [phone]   Fax:  [phone]  Physical Therapy Treatment  Patient Details  Name: [name] MRN: [id number] Date of Birth: Aug 15, 1965 Referring Provider: Melina Schools, MD   Encounter Date: 12/09/2017  PT End of Session - 12/09/17 1119    Visit Number  13    Number of Visits  15    Date for PT Re-Evaluation  12/14/17    Authorization Type  [employer] NAP - 100 visits per calendar year and only 4 modalities per day    Authorization Time Period  10/26/17 to 11/23/17; NEW: 11/23/17 to 12/14/17    Authorization - Visit Number  13    Authorization - Number of Visits  100    PT Start Time  8828    PT Stop Time  0034    PT Time Calculation (min)  44 min    Activity Tolerance  Patient tolerated treatment well;No increased pain    Behavior During Therapy  WFL for tasks assessed/performed       Past Medical History:  Diagnosis Date  . Asthma   . Endometriosis   . Eosinophilic esophagitis   . Feline esophagus   . GERD (gastroesophageal reflux disease)   . HSV-2 (herpes simplex virus 2) infection   . Hyperlipidemia   . Psoriasis   . Ulcerative colitis [facility])     Past Surgical History:  Procedure Laterality Date  . ABDOMINAL HYSTERECTOMY  11/2010  . COLONOSCOPY  04/13/2012   Procedure: COLONOSCOPY;  Surgeon: Lafayette Dragon, MD;  Location: [facility];  Service: Endoscopy;  Laterality: N/A;  . ESOPHAGOGASTRODUODENOSCOPY  04/13/2012   Procedure: ESOPHAGOGASTRODUODENOSCOPY (EGD);  Surgeon: Lafayette Dragon, MD;  Location: [facility];  Service: Endoscopy;  Laterality: N/A;  . LAPAROSCOPY  1990   For endometriosis   . TUBAL LIGATION  1999    There were no vitals filed for this visit.  Subjective Assessment - 12/09/17 1038    Subjective  Pt stated she is feeling good today, no reports of current pain.  Does report compliance iwht HEP daily.      Patient Stated Goals  strengthen neck  to be able to move it as much as possible, get back to work    Currently in Pain?  No/denies                       [facility] Adult PT Treatment/Exercise - 12/09/17 0001      Neck Exercises: Theraband   Shoulder Extension  15 reps;Green    Rows  15 reps;Green    Shoulder External Rotation  15 reps;Green    Shoulder Internal Rotation  15 reps;Green    Other Theraband Exercises  serratus punches with RTB 15x      Neck Exercises: Standing   Left / Chop Limitations  money GTB x20 reps    Wall Wash  2#, 2x10 reps; cues to stabilize scapula    Thumb Tacks  RUE body blade horiz and vert perturbations 5x10" each (elbow bent)    Other Standing Exercises  RUE scaption 2x15reps with 2# dumbbell    Other Standing Exercises  Y's on wall with liftoff 2x10reps      Neck Exercises: Supine   Other Supine Exercise  serratus punches 2# x15reps    Other Supine Exercise  rhymathic      Neck Exercises: Prone   Other Prone Exercise  RUE I, Y,  T x10 reps each    Other Prone Exercise  R GH IR and ER x20 reps      Manual Therapy   Manual Therapy  Soft tissue mobilization    Manual therapy comments  completed seperately from all other skilled interventions    Soft tissue mobilization  STM R infraspinatus, levator scapula, Rt UT, teres minor/major/lats for pain control and decreasing restrictions    Passive ROM  gentle ROM in supine      Neck Exercises: Stretches   Other Neck Stretches  pec stretch in doorway 3x30"    Other Neck Stretches  lats stretch supine with UE overhead and manual DKTC               PT Short Term Goals - 11/23/17 1120      PT SHORT TERM GOAL #1   Title  Pt will be independent wtih HEP and perform consistently in order to decrease pain and improve ROM.    Time  2    Period  Weeks    Status  Achieved      PT SHORT TERM GOAL #2   Title  Pt will have improved cervical AROM throughout by 5 deg in order to maximize her function at home and her ability to  drive.    Baseline  6/5: all improved significantly except extension and L LF which improved by 2deg and 3deg, respectively    Time  2    Period  Weeks    Status  Partially Met      PT SHORT TERM GOAL #3   Title  Pt will score 14/50 or < on the NDI to demo decreased self-perceived disability due to her neck pain.    Baseline  6/5: 0/50    Time  2    Period  Weeks    Status  Achieved      PT SHORT TERM GOAL #4   Title  Pt will have improved R GH IR and ER by 5 deg in order to decrease pain and demo decreased soft tissue restricitons    Time  3    Period  Weeks    Status  New    Target Date  12/14/17      PT SHORT TERM GOAL #5   Title  Pt will have decreased R shoulder pain when picking items up from the floor to demo improved overall function.    Time  3    Period  Weeks    Status  New        PT Long Term Goals - 11/23/17 1121      PT LONG TERM GOAL #1   Title  Pt will have improved cervical AROM throughout by 10 deg or > in order to further maximize driving and allow her to perform functional tasks at home with greater ease.    Baseline  6/5: all improved significantly except extension and L LF which improved by 2deg and 3deg, respectively    Time  4    Period  Weeks    Status  Partially Met      PT LONG TERM GOAL #2   Title  Pt will have 4/5 cervical MMT throughout to decrease pain and improve overall function.     Baseline  6/5: 4+/5 throughout    Status  Achieved      PT LONG TERM GOAL #3   Title  Pt will be able to perform deep neck flexor endurance test for  60 seconds with proper form and without exacerbations in pain to demo improved overall cervical strength and decrease her pain with functional tasks.    Baseline  6/5: 60 sec, no pain    Time  4    Period  Weeks    Status  Achieved      PT LONG TERM GOAL #4   Title  Pt will have decreased soft tissue restrictions throughout cervical, thoracic, and periscapular musculature to min to mod in order to allow for  improved ROM and decreased pain.    Baseline  6/5: minimal restrictions throughout    Time  4    Period  Weeks    Status  Achieved            Plan - 12/09/17 1529    Clinical Impression Statement  Session focus on shoulder mobility and strengthening primarly RTC musculature.  Added lats stretches to address mobility and stability exercises for strengthening.  Pt able to complete all exercises with min cueing to improve awareness of posture and reduce compensation for end range limitations, no pain through session.  EOS with soft tissue mobilization to address restrictions, noted improved ER with increased ease following manual.      Rehab Potential  Good    PT Frequency  2x / week    PT Duration  4 weeks    PT Treatment/Interventions  ADLs/Self Care Home Management;Cryotherapy;Electrical Stimulation;Moist Heat;Ultrasound;Functional mobility training;Therapeutic activities;Therapeutic exercise;Balance training;Neuromuscular re-education;Patient/family education;Manual techniques;Scar mobilization;Passive range of motion;Dry needling;Energy conservation;Taping    PT Next Visit Plan  dry needling R RTC (infraspinatus as needed) and address teres major/lats with needling; serratus strengthening to assist with shoulder pain and function; continue manual pec minor release    PT Home Exercise Plan  eval: supine retractions  5/14: cervical excursions in sitting; 5/16: upper trap and levator scap stretching; 6/19: standing  GH IR/ER  with GTB, money with GTB       Patient will benefit from skilled therapeutic intervention in order to improve the following deficits and impairments:  Decreased activity tolerance, Decreased endurance, Decreased range of motion, Decreased strength, Hypomobility, Increased fascial restricitons, Increased muscle spasms, Impaired UE functional use, Improper body mechanics, Postural dysfunction, Pain  Visit Diagnosis: Chronic right shoulder pain  Cervicalgia  Muscle  weakness (generalized)  Other symptoms and signs involving the musculoskeletal system     Problem List Patient Active Problem List   Diagnosis Date Noted  . 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 Reflux esophagitis 04/13/2012  . Esophageal reflux 04/13/2012   Ihor Austin, [place]; [place]  Aldona Lento 12/09/2017, 3:35 PM  [place], [place], [address] Phone: [phone]   Fax:  [phone]  Name: [name] MRN: [id number] Date of Birth: 12/30/1965

## 2017-12-14 ENCOUNTER — Telehealth (HOSPITAL_COMMUNITY): Payer: Self-pay

## 2017-12-14 ENCOUNTER — Ambulatory Visit (HOSPITAL_COMMUNITY): Payer: 59

## 2017-12-14 DIAGNOSIS — R29898 Other symptoms and signs involving the musculoskeletal system: Secondary | ICD-10-CM

## 2017-12-14 DIAGNOSIS — M542 Cervicalgia: Secondary | ICD-10-CM

## 2017-12-14 DIAGNOSIS — M25511 Pain in right shoulder: Principal | ICD-10-CM

## 2017-12-14 DIAGNOSIS — G8929 Other chronic pain: Secondary | ICD-10-CM

## 2017-12-14 DIAGNOSIS — M6281 Muscle weakness (generalized): Secondary | ICD-10-CM

## 2017-12-14 NOTE — Therapy (Signed)
[place], [place], [address] Phone: [phone]   Fax:  [phone]  Physical Therapy Treatment/Discharge Summary  Patient Details  Name: [name] MRN: [id number] Date of Birth: [birth year] Referring Provider: Melina Schools, MD   Encounter Date: 12/14/2017  PT End of Session - 12/14/17 1508    Visit Number  14    Number of Visits  15    Date for PT Re-Evaluation  12/14/17    Authorization Type  [employer] NAP - 100 visits per calendar year and only 4 modalities per day    Authorization Time Period  10/26/17 to 11/23/17; NEW: 11/23/17 to 12/14/17    Authorization - Visit Number  14    Authorization - Number of Visits  100    PT Start Time  9381    PT Stop Time  8299    PT Time Calculation (min)  15 min    Activity Tolerance  Patient tolerated treatment well;No increased pain    Behavior During Therapy  WFL for tasks assessed/performed       Past Medical History:  Diagnosis Date  . Asthma   . Endometriosis   . Eosinophilic esophagitis   . Feline esophagus   . GERD (gastroesophageal reflux disease)   . HSV-2 (herpes simplex virus 2) infection   . Hyperlipidemia   . Psoriasis   . Ulcerative colitis [facility])     Past Surgical History:  Procedure Laterality Date  . ABDOMINAL HYSTERECTOMY  11/2010  . COLONOSCOPY  04/13/2012   Procedure: COLONOSCOPY;  Surgeon: Lafayette Dragon, MD;  Location: [facility];  Service: Endoscopy;  Laterality: N/A;  . ESOPHAGOGASTRODUODENOSCOPY  04/13/2012   Procedure: ESOPHAGOGASTRODUODENOSCOPY (EGD);  Surgeon: Lafayette Dragon, MD;  Location: [facility];  Service: Endoscopy;  Laterality: N/A;  . LAPAROSCOPY  1990   For endometriosis   . TUBAL LIGATION  1999    There were no vitals filed for this visit.  Subjective Assessment - 12/14/17 1510    Subjective  Pt reports that she has a crick in her middle back which is bother her, but otherwise everything has been fine. No neck pain or  shoulder pain at the moment.     Patient Stated Goals  strengthen neck to be able to move it as much as possible, get back to work    Currently in Pain?  No/denies         [facility] PT Assessment - 12/14/17 0001      Assessment   Medical Diagnosis  encounter for other specified surgical aftercare (post-op TDA C5-6); dry needling added for the R shoulder    Referring Provider  Melina Schools, MD    Onset Date/Surgical Date  09/09/17    Next MD Visit  3 months      Observation/Other Assessments   Focus on Therapeutic Outcomes (FOTO)   4% limited (was 59%)      AROM   Right Shoulder Internal Rotation  58 Degrees was 57    Right Shoulder External Rotation  83 Degrees was 40           PT Education - 12/14/17 1508    Education provided  Yes    Education Details  discharge plans    Person(s) Educated  Patient    Methods  Explanation;Demonstration    Comprehension  Verbalized understanding;Returned demonstration       PT Short Term Goals - 12/14/17 1512      PT SHORT TERM  GOAL #1   Title  Pt will be independent wtih HEP and perform consistently in order to decrease pain and improve ROM.    Time  2    Period  Weeks    Status  Achieved      PT SHORT TERM GOAL #2   Title  Pt will have improved cervical AROM throughout by 5 deg in order to maximize her function at home and her ability to drive.    Baseline  6/5: all improved significantly except extension and L LF which improved by 2deg and 3deg, respectively    Time  2    Period  Weeks    Status  Partially Met      PT SHORT TERM GOAL #3   Title  Pt will score 14/50 or < on the NDI to demo decreased self-perceived disability due to her neck pain.    Baseline  6/5: 0/50    Time  2    Period  Weeks    Status  Achieved      PT SHORT TERM GOAL #4   Title  Pt will have improved R GH IR and ER by 5 deg in order to decrease pain and demo decreased soft tissue restricitons    Baseline  6/26: R GH IR: 58deg, R GH ER: 83 (140deg  arc)    Time  3    Period  Weeks    Status  Partially Met      PT SHORT TERM GOAL #5   Title  Pt will have decreased R shoulder pain when picking items up from the floor to demo improved overall function.    Baseline  6/26: no issues    Time  3    Period  Weeks    Status  Achieved        PT Long Term Goals - 11/23/17 1121      PT LONG TERM GOAL #1   Title  Pt will have improved cervical AROM throughout by 10 deg or > in order to further maximize driving and allow her to perform functional tasks at home with greater ease.    Baseline  6/5: all improved significantly except extension and L LF which improved by 2deg and 3deg, respectively    Time  4    Period  Weeks    Status  Partially Met      PT LONG TERM GOAL #2   Title  Pt will have 4/5 cervical MMT throughout to decrease pain and improve overall function.     Baseline  6/5: 4+/5 throughout    Status  Achieved      PT LONG TERM GOAL #3   Title  Pt will be able to perform deep neck flexor endurance test for 60 seconds with proper form and without exacerbations in pain to demo improved overall cervical strength and decrease her pain with functional tasks.    Baseline  6/5: 60 sec, no pain    Time  4    Period  Weeks    Status  Achieved      PT LONG TERM GOAL #4   Title  Pt will have decreased soft tissue restrictions throughout cervical, thoracic, and periscapular musculature to min to mod in order to allow for improved ROM and decreased pain.    Baseline  6/5: minimal restrictions throughout    Time  4    Period  Weeks    Status  Achieved  Plan - 12/14/17 1533    Clinical Impression Statement  PT reassessed pt's goals and outcome measures this date. Pt has made tremendous improvements overall since starting therapy for her R shoulder. Her R ER ROM improved drastically to 83deg (was 40) and her IR improved by 1 deg to 58deg; this puts her total arc at 140deg, just shy of the normal 150deg, indicating that  her GH ROM is WNL. She also has no pain with her R shoulder anymore and tolerated a lot of OH therex at her last session well with no increases in pain. Due to progress made, pt is ready and will be discharged to her HEP. PT educated pt that she could return with referral if needed and she verbalized understanding    Rehab Potential  Good    PT Frequency  2x / week    PT Duration  4 weeks    PT Treatment/Interventions  ADLs/Self Care Home Management;Cryotherapy;Electrical Stimulation;Moist Heat;Ultrasound;Functional mobility training;Therapeutic activities;Therapeutic exercise;Balance training;Neuromuscular re-education;Patient/family education;Manual techniques;Scar mobilization;Passive range of motion;Dry needling;Energy conservation;Taping    PT Next Visit Plan  discharged    PT Home Exercise Plan  eval: supine retractions  5/14: cervical excursions in sitting; 5/16: upper trap and levator scap stretching; 6/19: standing  GH IR/ER  with GTB, money with GTB    Consulted and Agree with Plan of Care  Patient       Patient will benefit from skilled therapeutic intervention in order to improve the following deficits and impairments:  Decreased activity tolerance, Decreased endurance, Decreased range of motion, Decreased strength, Hypomobility, Increased fascial restricitons, Increased muscle spasms, Impaired UE functional use, Improper body mechanics, Postural dysfunction, Pain  Visit Diagnosis: Chronic right shoulder pain  Cervicalgia  Muscle weakness (generalized)  Other symptoms and signs involving the musculoskeletal system     Problem List Patient Active Problem List   Diagnosis Date Noted  . 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 Reflux esophagitis 04/13/2012  . Esophageal reflux 04/13/2012        PHYSICAL THERAPY DISCHARGE SUMMARY  Visits from Start of Care: 14  Current functional level related to goals / functional outcomes: See above   Remaining  deficits: See above   Education / Equipment: HEP  Plan: Patient agrees to discharge.  Patient goals were met. Patient is being discharged due to meeting the stated rehab goals.  ?????       Geraldine Solar PT, [place] [address] [place], [place], [address] Phone: [phone]   Fax:  [phone]  Name: [name] MRN: [id number] Date of Birth: [birth year]

## 2017-12-14 NOTE — Telephone Encounter (Signed)
Asked for ins card again pt did not have it . NF 12/14/17

## 2017-12-16 ENCOUNTER — Encounter (HOSPITAL_COMMUNITY): Payer: 59

## 2018-02-19 ENCOUNTER — Other Ambulatory Visit: Payer: Self-pay | Admitting: Family Medicine

## 2018-02-19 DIAGNOSIS — Z8619 Personal history of other infectious and parasitic diseases: Secondary | ICD-10-CM

## 2018-02-22 ENCOUNTER — Telehealth: Payer: Self-pay

## 2018-02-22 DIAGNOSIS — Z8619 Personal history of other infectious and parasitic diseases: Secondary | ICD-10-CM

## 2018-02-22 NOTE — Telephone Encounter (Signed)
PA initiated via Covermymeds; KEY: [id number]. Awaiting determination.

## 2018-02-23 NOTE — Telephone Encounter (Signed)
PA denied. Awaiting information.  

## 2018-02-23 NOTE — Telephone Encounter (Signed)
Quantity limit applies. Insurance will cover #30 tablets per dispensing.

## 2018-02-24 NOTE — Telephone Encounter (Signed)
Patient called and states she has some questions in reference to the PA for her medication. She is requesting a callback to see if anything can be done to expedite this process. CB# [phone]

## 2018-02-28 MED ORDER — VALACYCLOVIR HCL 500 MG PO TABS: ORAL_TABLET | ORAL | 1 refills | Status: DC

## 2018-02-28 NOTE — Addendum Note (Signed)
Addended by: Steve Rattler A on: 02/28/2018 09:33 AM   Modules accepted: Orders

## 2018-02-28 NOTE — Telephone Encounter (Signed)
New rx sent in

## 2018-02-28 NOTE — Telephone Encounter (Signed)
PA approved 30 tablets per month.

## 2018-03-24 ENCOUNTER — Encounter (HOSPITAL_COMMUNITY): Payer: Self-pay

## 2018-06-09 ENCOUNTER — Other Ambulatory Visit: Payer: Self-pay

## 2018-06-09 ENCOUNTER — Encounter (HOSPITAL_COMMUNITY): Payer: Self-pay

## 2018-06-09 ENCOUNTER — Ambulatory Visit (HOSPITAL_COMMUNITY): Payer: 59 | Attending: Orthopedic Surgery

## 2018-06-09 DIAGNOSIS — R29898 Other symptoms and signs involving the musculoskeletal system: Secondary | ICD-10-CM | POA: Diagnosis present

## 2018-06-09 DIAGNOSIS — M25511 Pain in right shoulder: Secondary | ICD-10-CM | POA: Insufficient documentation

## 2018-06-09 DIAGNOSIS — M25611 Stiffness of right shoulder, not elsewhere classified: Secondary | ICD-10-CM | POA: Insufficient documentation

## 2018-06-09 DIAGNOSIS — G8929 Other chronic pain: Secondary | ICD-10-CM | POA: Diagnosis present

## 2018-06-09 NOTE — Patient Instructions (Signed)
Use ice daily for inflammation and pain. 20 minutes on and 20 minutes off. Use as often as needed.    TOWEL SLIDES COMPLETE FOR 1-3 MINUTES, 3-5 TIMES PER DAY  SHOULDER: Flexion On Table   Place hands on table, elbows straight. Move hips away from body. Press hands down into table. Hold ___ seconds. ___ reps per set, ___ sets per day, ___ days per week  Abduction (Passive)   With arm out to side, resting on table, lower head toward arm, keeping trunk away from table. Hold ____ seconds. Repeat ____ times. Do ____ sessions per day.  Copyright  VHI. All rights reserved.     Internal Rotation (Assistive)   Seated with elbow bent at right angle and held against side, slide arm on table surface in an inward arc. Repeat ____ times. Do ____ sessions per day. Activity: Use this motion to brush crumbs off the table.  Copyright  VHI. All rights reserved.    COMPLETE PENDULUM EXERCISES FOR 30 SECONDS TO A MINUTE EACH, 3-5 TIMES PER DAY. ROM: Pendulum (Side-to-Side)    http://orth.exer.us/792   Copyright  VHI. All rights reserved.  Pendulum Forward/Back   Bend forward 90 at waist, using table for support. Rock body forward and back to swing arm. Repeat ____ times. Do ____ sessions per day.   Copyright  VHI. All rights reserved.  AROM: Wrist Extension   With right palm down, bend wrist up. Repeat 10____ times per set. Do ____ sets per session. Do __3__ sessions per day.  Copyright  VHI. All rights reserved.   AROM: Wrist Flexion   With right palm up, bend wrist up. Repeat ___10_ times per set. Do ____ sets per session. Do __3__ sessions per day.  Copyright  VHI. All rights reserved.   AROM: Forearm Pronation / Supination   With right arm in handshake position, slowly rotate palm down until stretch is felt. Relax. Then rotate palm up until stretch is felt. Repeat __10__ times per set. Do ____ sets per session. Do __3__ sessions per day.  Copyright  VHI. All  rights reserved.   AFlexion (Passive)   Use other hand to bend elbow, with thumb toward same shoulder. Do NOT force this motion. Hold ____ seconds. Repeat ____ times. Do ____ sessions per day.

## 2018-06-10 NOTE — Therapy (Addendum)
[facility] [address] [place], [place], [address] Phone: [phone]   Fax:  [phone]  Occupational Therapy Evaluation  Patient Details  Name: [name] MRN: [id number] Date of Birth: 11/10/1965 Referring Provider (OT): Duwayne HeckJason Rogers, MD   Encounter Date: 06/09/2018  OT End of Session - 06/10/18 2028    Visit Number  1    Number of Visits  12    Date for OT Re-Evaluation  07/22/18   mini reassess: 07/07/18   Authorization Type  AETNA     Authorization Time Period  100 visits per year PT/OT combined 0 used    Authorization - Visit Number  1    Authorization - Number of Visits  100    OT Start Time  1034    OT Stop Time  1115    OT Time Calculation (min)  41 min    Activity Tolerance  Patient tolerated treatment well    Behavior During Therapy  [facility] for tasks assessed/performed       Past Medical History:  Diagnosis Date  . Asthma   . Endometriosis   . Eosinophilic esophagitis   . Feline esophagus   . GERD (gastroesophageal reflux disease)   . HSV-2 (herpes simplex virus 2) infection   . Hyperlipidemia   . Psoriasis   . Ulcerative colitis [facility](HCC)     Past Surgical History:  Procedure Laterality Date  . ABDOMINAL HYSTERECTOMY  11/2010  . COLONOSCOPY  04/13/2012   Procedure: COLONOSCOPY;  Surgeon: Hart Carwinora M Brodie, MD;  Location: [facility];  Service: Endoscopy;  Laterality: N/A;  . ESOPHAGOGASTRODUODENOSCOPY  04/13/2012   Procedure: ESOPHAGOGASTRODUODENOSCOPY (EGD);  Surgeon: Hart Carwinora M Brodie, MD;  [facility];  Service: Endoscopy;  Laterality: N/A;  . LAPAROSCOPY  1990   For endometriosis   . TUBAL LIGATION  1999    There were no vitals filed for this visit.     06/09/18 1038  Symptoms/Limitations  Subjective  S: They took out bone spurs and cleaned it out.   Pertinent History Patient is a 52 y/o female S/P right shoulder arthroscopy that was completed on 06/05/18. Dr. Aundria Rudogers has referred patient to  occupational therapy for evaluation and treatment..  Patient Stated Goals To be able to use this arm and go back to work.   Pain Assessment  Currently in Pain? No/denies      06/09/18 1038  Assessment  Medical Diagnosis right shoulder arthroscopy  Referring Provider (OT) Duwayne HeckJason Rogers, MD  Onset Date/Surgical Date 06/05/18  Hand Dominance Right  Next MD Visit 06/19/18  Prior Therapy None for this procedure  Precautions  Precautions Shoulder  Type of Shoulder Precautions Basic protocol: Complete P/ROM/AA/ROM (week 1-3) Once patient has full P/ROM progress to phase II (week 4-5 07/10/18-07/17/18) Progress to A/ROM, avoid repetitive overhead activities. Week 7-8 ( 07/24/18-07/31/18) begin strengthening.  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   Mobility  Mobility Status Independent  Written Expression  Dominant Hand Right  Vision - History  Baseline Vision Wears glasses all the time  Cognition  Overall  Cognitive Status Within Functional Limits for tasks assessed  Observation/Other Assessments  Focus on Therapeutic Outcomes (FOTO)  4/100  ROM / Strength  AROM / PROM / Strength AROM;PROM;Strength  Palpation  Palpation comment max fascial restrictions in right upper arm, trapezius, and scapularis region.   AROM  Overall AROM  Unable to assess;Due to precautions  PROM  Overall PROM Comments Assessed supine. IR/er adducted  PROM Assessment Site Shoulder  Right/Left Shoulder Right  Right Shoulder Flexion 90 Degrees  Right Shoulder ABduction 85  Degrees  Right Shoulder Internal Rotation 90 Degrees  Right Shoulder External Rotation 10 Degrees  Strength  Overall Strength Unable to assess;Due to precautions                      OT Short Term Goals - 06/10/18 2031      OT SHORT TERM GOAL #1   Title  Patient will be educated and independent with HEP to faciliate her progress in therapy and allow her to return to using her RUE for daily and work related tasks.    Time  3    Period  Weeks    Status  New    Target Date  07/01/18      OT SHORT TERM GOAL #2   Title  Pt will increase P/ROM of RUE to [facility] to increase ability to complete upper body dressing tasks with less difficulty.     Time  3    Period  Weeks      OT SHORT TERM GOAL #3   Title  Patient will increase RUE strength to 3/5 in order to complete simple daily tasks as waist level.    Time  3    Period  Weeks      OT SHORT TERM GOAL #4   Title  Patient will report a decrease in pain of approximately 5/10 when completing daily tasks with the RUE.     Time  3    Period  Weeks    Status  New      OT SHORT TERM GOAL #5   Title  Pt will decrease RUE fascial restrictions to moderate amount in order to increase functional mobility needed to complete low level reaching tasks.     Time  4    Period  Weeks        OT Long Term Goals - 06/10/18 2036      OT LONG TERM GOAL #1   Title  Patient will return to highest level of independence while usinh her right UE for all daily and work related tasks.     Time  6    Period  Weeks    Status  New      OT LONG TERM GOAL #2   Title  Pt will increase her RUE A/ROM to WNL to increase ability to complete overhead lifting tasks with less difficulty.     Time  6    Period  Weeks    Status  New      OT LONG TERM GOAL #3   Title  Pt will increase her RUE strength to 4+/5 in order to return to completing normal household lifting tasks and household chores with increased comfort and shoulder stability.     Time  6     Period  Weeks    Status  New      OT LONG TERM GOAL #4   Title  Pt will decrease fascial restrictions to minimum  amount in order to increase functional mobility needed to complete overhead reaching tasks with less difficulty.     Time  6    Period  Weeks    Status  New      OT LONG TERM GOAL #5   Title  Pt will report a decrease in pain level of approximately 2/10 or less in order to complete all daily and work related tasks while using her RUE as her dominant extremity.     Time  6    Period  Weeks    Status  New           06/09/18 1114  OT Assessment and Plan  Clinical Impression Statement A: Patient is a 52 y/o female S/P right shoulder arthroscopy causing increased pain, fascial restrictions. and decreased ROM and strength resulting in difficulty completing daily and work related tasks while utilzing her RUE as her dominant extremity.   Occupational Profile and client history currently impacting functional performance Pt is motivated to return to prior level of function. Pt was independent prior to procedure.  Occupational performance deficits (Please refer to evaluation for details): ADL's;Rest and Sleep;IADL's;Work  Pt will benefit from skilled therapeutic intervention in order to improve on the following deficits  Decreased range of motion;Pain;Impaired UE functional use;Increased fascial restrictions;Decreased strength  Rehab Potential Excellent  Current Impairments/barriers affecting progress: None noted  OT Frequency 2x / week  OT Duration 6 weeks  OT Treatment/Interventions Self-care/ADL training;Moist Heat;DME and/or AE instruction;Therapeutic activities;Aquatic Therapy;Ultrasound;Therapeutic exercise;Passive range of motion;Neuromuscular education;Cryotherapy;Electrical Stimulation;Manual Therapy;Patient/family education  Plan P: Patient will benefit from skilled OT services to increase functional performance during ADL and work related tasks. Treatment Plan: No  protocol sent by Dr. Aundria Rudogers. Follow standard protocol. Myofascial release, manual stretching, P/ROM, AA/ROM, A/ROM, general shoulder and scapular strengthening. Modalities PRN.   Clinical Decision Making Moderate  Consulted and Agree with Plan of Care Patient        Patient will benefit from skilled therapeutic intervention in order to improve the following deficits and impairments:  Decreased range of motion, Pain, Impaired UE functional use, Increased fascial restrictions, Decreased strength  Visit Diagnosis: Chronic right shoulder pain - Plan: Ot plan of care cert/re-cert  Other symptoms and signs involving the musculoskeletal system - Plan: Ot plan of care cert/re-cert  Stiffness of right shoulder, not elsewhere classified - Plan: Ot plan of care cert/re-cert    Problem List Patient Active Problem List   Diagnosis Date Noted  . 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 Reflux esophagitis 04/13/2012  . Esophageal reflux 04/13/2012   Limmie PatriciaLaura Camila Maita, OTR/L,CBIS  [phone]  06/10/2018, 8:45 PM  [facility] [address] [place], [place], [address] Phone: [phone]   Fax:  [phone]  Name: [name] MRN: [id number] Date of Birth: 02/26/1966

## 2018-06-13 ENCOUNTER — Encounter (HOSPITAL_COMMUNITY): Payer: Self-pay

## 2018-06-13 ENCOUNTER — Ambulatory Visit (HOSPITAL_COMMUNITY): Payer: 59

## 2018-06-13 DIAGNOSIS — M25511 Pain in right shoulder: Secondary | ICD-10-CM | POA: Diagnosis not present

## 2018-06-13 DIAGNOSIS — R29898 Other symptoms and signs involving the musculoskeletal system: Secondary | ICD-10-CM

## 2018-06-13 DIAGNOSIS — M25611 Stiffness of right shoulder, not elsewhere classified: Secondary | ICD-10-CM

## 2018-06-13 DIAGNOSIS — G8929 Other chronic pain: Secondary | ICD-10-CM

## 2018-06-13 NOTE — Therapy (Signed)
[place], [place], [address] Phone: [phone]   Fax:  [phone]  Occupational Therapy Treatment  Patient Details  Name: [name] MRN: [id number] Date of Birth: 01/14/1966 Referring Provider (OT): Victorino December, MD   Encounter Date: 06/13/2018  OT End of Session - 06/13/18 1000    Visit Number  2    Number of Visits  12    Date for OT Re-Evaluation  07/22/18   mini reassess: 07/07/18   Authorization Type  AETNA     Authorization Time Period  100 visits per year PT/OT combined 0 used    Authorization - Visit Number  2    Authorization - Number of Visits  100    OT Start Time  [id number]    OT Stop Time  0940    OT Time Calculation (min)  35 min    Activity Tolerance  Patient tolerated treatment well    Behavior During Therapy  [facility] for tasks assessed/performed       Past Medical History:  Diagnosis Date  . Asthma   . Endometriosis   . Eosinophilic esophagitis   . Feline esophagus   . GERD (gastroesophageal reflux disease)   . HSV-2 (herpes simplex virus 2) infection   . Hyperlipidemia   . Psoriasis   . Ulcerative colitis [facility])     Past Surgical History:  Procedure Laterality Date  . ABDOMINAL HYSTERECTOMY  11/2010  . COLONOSCOPY  04/13/2012   Procedure: COLONOSCOPY;  Surgeon: Lafayette Dragon, MD;  Location: [facility];  Service: Endoscopy;  Laterality: N/A;  . ESOPHAGOGASTRODUODENOSCOPY  04/13/2012   Procedure: ESOPHAGOGASTRODUODENOSCOPY (EGD);  Surgeon: Lafayette Dragon, MD;  Location[facility];  Service: Endoscopy;  Laterality: N/A;  . LAPAROSCOPY  1990   For endometriosis   . TUBAL LIGATION  1999    There were no vitals filed for this visit.  Subjective Assessment - 06/13/18 0922    Subjective   S: I'm trying to do more with it.     Currently in Pain?  Yes    Pain Score  3     Pain Location  Shoulder    Pain Orientation  Right    Pain Descriptors / Indicators  Aching    Pain Type   Surgical pain    Pain Radiating Towards  N/A    Pain Onset  In the past 7 days    Pain Frequency  Intermittent    Aggravating Factors   movement    Pain Relieving Factors  rest, pain medication    Effect of Pain on Daily Activities  Pt is unable to utilize her RUE for daily tasks at this time.     Multiple Pain Sites  No         [facility] OT Assessment - 06/13/18 0925      Assessment   Medical Diagnosis  right shoulder arthroscopy      Precautions   Precautions  Shoulder    Type of Shoulder Precautions  Basic protocol: Complete P/ROM/AA/ROM (week 1-3) Once patient has full P/ROM progress to phase II (week 4-5 07/10/18-07/17/18) Progress to A/ROM, avoid repetitive overhead activities. Week 7-8 ( 07/24/18-07/31/18) begin strengthening.    Shoulder Interventions  Shoulder sling/immobilizer               OT Treatments/Exercises (OP) - 06/13/18 0925      Exercises   Exercises  Shoulder      Shoulder Exercises: Supine  Protraction  PROM;5 reps    Horizontal ABduction  PROM;5 reps    External Rotation  PROM;5 reps    Internal Rotation  PROM;5 reps    Flexion  PROM;5 reps    ABduction  PROM;5 reps      Shoulder Exercises: Seated   Extension  AROM;10 reps    Row  AROM;10 reps    Other Seated Exercises  Scapular depression; 10X; A/ROM      Shoulder Exercises: Therapy Ball   Flexion  Both;10 reps    ABduction  Both;10 reps      Shoulder Exercises: ROM/Strengthening   Thumb Tacks  1' low level    Anterior Glide  3x10"    Prot/Ret//Elev/Dep  1'      Shoulder Exercises: Isometric Strengthening   Flexion  Supine;3X5"    Extension  Supine;3X5"    External Rotation  Supine;3X5"    Internal Rotation  Supine;3X5"    ABduction  Supine;3X5"    ADduction  Supine;3X5"      Manual Therapy   Manual Therapy  Soft tissue mobilization    Manual therapy comments  Manual therapy completed separately from exercises    Soft tissue mobilization  Myofascial release and manual stretching  completed to right upper arm, trapezius, and scapularis region to increase joint mobility and decrease fascial restrictions.              OT Education - 06/13/18 1000    Education Details  Reviewed therapy goals    Person(s) Educated  Patient    Methods  Explanation    Comprehension  Verbalized understanding       OT Short Term Goals - 06/13/18 0930      OT SHORT TERM GOAL #1   Title  Patient will be educated and independent with HEP to faciliate her progress in therapy and allow her to return to using her RUE for daily and work related tasks.    Time  3    Period  Weeks    Status  On-going      OT SHORT TERM GOAL #2   Title  Pt will increase P/ROM of RUE to [facility] to increase ability to complete upper body dressing tasks with less difficulty.     Time  3    Period  Weeks      OT SHORT TERM GOAL #3   Title  Patient will increase RUE strength to 3/5 in order to complete simple daily tasks as waist level.    Time  3    Period  Weeks    Status  On-going      OT SHORT TERM GOAL #4   Title  Patient will report a decrease in pain of approximately 5/10 when completing daily tasks with the RUE.     Time  3    Period  Weeks    Status  On-going      OT SHORT TERM GOAL #5   Title  Pt will decrease RUE fascial restrictions to moderate amount in order to increase functional mobility needed to complete low level reaching tasks.     Time  3    Period  Weeks    Status  On-going        OT Long Term Goals - 06/13/18 0931      OT LONG TERM GOAL #1   Title  Patient will return to highest level of independence while usinh her right UE for all daily and work related tasks.  Time  6    Period  Weeks    Status  On-going    Target Date  07/21/18      OT LONG TERM GOAL #2   Title  Pt will increase her RUE A/ROM to WNL to increase ability to complete overhead lifting tasks with less difficulty.     Time  6    Period  Weeks    Status  On-going      OT LONG TERM GOAL #3   Title   Pt will increase her RUE strength to 4+/5 in order to return to completing normal household lifting tasks and household chores with increased comfort and shoulder stability.     Time  6    Period  Weeks    Status  On-going      OT LONG TERM GOAL #4   Title  Pt will decrease fascial restrictions to minimum amount in order to increase functional mobility needed to complete overhead reaching tasks with less difficulty.     Time  6    Period  Weeks    Status  On-going      OT LONG TERM GOAL #5   Title  Pt will report a decrease in pain level of approximately 2/10 or less in order to complete all daily and work related tasks while using her RUE as her dominant extremity.     Time  6    Period  Weeks    Status  On-going            Plan - 06/13/18 1000    Clinical Impression Statement  A: Initiated myofascial release, manual stretching, isometric exercises, and therapy ball stretches. Patient was able to tolerate further passive ROM compared to initial evaluation measurements. VC for form and technique were provided. Manual techniques completed to address fascial restrictions in right upper arm proximal to [facility] joint.     Plan  P: Add caudal glide. Work on increasing passive ROM tolerance in order to increase Upper body dressing with RUE.     Consulted and Agree with Plan of Care  Patient       Patient will benefit from skilled therapeutic intervention in order to improve the following deficits and impairments:  Decreased range of motion, Pain, Impaired UE functional use, Increased fascial restrictions, Decreased strength  Visit Diagnosis: Chronic right shoulder pain  Stiffness of right shoulder, not elsewhere classified  Other symptoms and signs involving the musculoskeletal system    Problem List Patient Active Problem List   Diagnosis Date Noted  . 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 Reflux esophagitis 04/13/2012  . Esophageal reflux 04/13/2012    Ailene Ravel, OTR/L,CBIS  [phone]  06/13/2018, 10:04 AM  [facility] [address] [place], [place], [address] Phone: [phone]   Fax:  [phone]  Name: [name] MRN: [id number] Date of Birth: 04/20/1966

## 2018-06-15 ENCOUNTER — Telehealth (HOSPITAL_COMMUNITY): Payer: Self-pay | Admitting: Occupational Therapy

## 2018-06-15 ENCOUNTER — Ambulatory Visit (HOSPITAL_COMMUNITY): Payer: 59 | Admitting: Occupational Therapy

## 2018-06-15 NOTE — Telephone Encounter (Signed)
Patient said she is not feeling well and will not be coming in.

## 2018-06-19 ENCOUNTER — Encounter (HOSPITAL_COMMUNITY): Payer: Self-pay

## 2018-06-19 ENCOUNTER — Ambulatory Visit (HOSPITAL_COMMUNITY): Payer: 59

## 2018-06-19 DIAGNOSIS — M25511 Pain in right shoulder: Principal | ICD-10-CM

## 2018-06-19 DIAGNOSIS — R29898 Other symptoms and signs involving the musculoskeletal system: Secondary | ICD-10-CM

## 2018-06-19 DIAGNOSIS — G8929 Other chronic pain: Secondary | ICD-10-CM

## 2018-06-19 DIAGNOSIS — M25611 Stiffness of right shoulder, not elsewhere classified: Secondary | ICD-10-CM

## 2018-06-19 NOTE — Therapy (Signed)
[facility] [address] [place], [place], [address] Phone: [phone]   Fax:  [phone]  Occupational Therapy Treatment  Patient Details  Name: [name] MRN: [id number] Date of Birth: 02/08/1966 Referring Provider (OT): Duwayne HeckJason Rogers, MD   Progress Note Reporting Period 06/09/18 to 06/19/18  See note below for Objective Data and Assessment of Progress/Goals.       Encounter Date: 06/19/2018  OT End of Session - 06/19/18 1556    Visit Number  3    Number of Visits  12    Date for OT Re-Evaluation  07/22/18   mini reassess: 07/07/18   Authorization Type  [employer]ET[employer]     Authorization Time Period  100 visits per year PT/OT combined 0 used    Authorization - Visit Number  3    Authorization - Number of Visits  100    OT Start Time  1520    OT Stop Time  1558    OT Time Calculation (min)  38 min    Activity Tolerance  Patient tolerated treatment well    Behavior During Therapy  WFL for tasks assessed/performed       Past Medical History:  Diagnosis Date  . Asthma   . Endometriosis   . Eosinophilic esophagitis   . Feline esophagus   . GERD (gastroesophageal reflux disease)   . HSV-2 (herpes simplex virus 2) infection   . Hyperlipidemia   . Psoriasis   . Ulcerative colitis [facility])     Past Surgical History:  Procedure Laterality Date  . ABDOMINAL HYSTERECTOMY  11/2010  . COLONOSCOPY  04/13/2012   Procedure: COLONOSCOPY;  Surgeon: Hart Carwinora M Brodie, MD;  Location: [facility];  Service: Endoscopy;  Laterality: N/A;  . ESOPHAGOGASTRODUODENOSCOPY  04/13/2012   Procedure: ESOPHAGOGASTRODUODENOSCOPY (EGD);  Surgeon: Hart Carwinora M Brodie, MD;  [facility];  Service: Endoscopy;  Laterality: N/A;  . LAPAROSCOPY  1990   For endometriosis   . TUBAL LIGATION  1999    There were no vitals filed for this visit.  Subjective Assessment - 06/19/18 1521    Subjective   S: it's just sore.     Currently in Pain?  Yes    Pain  Score  4     Pain Location  Shoulder    Pain Orientation  Right    Pain Descriptors / Indicators  Sore    Pain Type  Surgical pain         [facility] OT Assessment - 06/19/18 1522      Assessment   Medical Diagnosis  right shoulder arthroscopy    Referring Provider (OT)  Duwayne HeckJason Rogers, MD      Precautions   Precautions  Shoulder    Type of Shoulder Precautions  Basic protocol: Complete P/ROM/AA/ROM (week 1-3) Once patient has full P/ROM progress to phase II (week 4-5 07/10/18-07/17/18) Progress to A/ROM, avoid repetitive overhead activities. Week 7-8 ( 07/24/18-07/31/18) begin strengthening.    Shoulder Interventions  Shoulder sling/immobilizer      ROM / Strength   AROM / PROM / Strength  AROM;PROM;Strength      Palpation   Palpation comment  Moderate fascial restrictions in right anterior shoulder, upper trapezius, and scapularis region.       AROM   Overall AROM Comments  Assessed standing with IR/er adducted. A/ROM assessed for the first time today.    AROM Assessment Site  Shoulder    Right/Left Shoulder  Right    Right Shoulder Flexion  125 Degrees    Right Shoulder ABduction  125 Degrees    Right Shoulder Internal Rotation  90 Degrees    Right Shoulder External Rotation  85 Degrees      PROM   Overall PROM Comments  Assessed supine. IR/er adducted   Pt able to achieve ROM WFL during passive stretching.    PROM Assessment Site  Shoulder    Right/Left Shoulder  Right      Strength   Overall Strength Comments  Assessed standing. IR/er adducted. Assessed for the first time this session.     Strength Assessment Site  Shoulder    Right/Left Shoulder  Right    Right Shoulder Flexion  3/5    Right Shoulder ABduction  3/5    Right Shoulder Internal Rotation  3/5    Right Shoulder External Rotation  3/5               OT Treatments/Exercises (OP) - 06/19/18 1534      Exercises   Exercises  Shoulder      Shoulder Exercises: Supine   Protraction  PROM;5 reps;AAROM;10  reps    Horizontal ABduction  PROM;5 reps;AAROM;10 reps    External Rotation  PROM;5 reps;AAROM;10 reps    Internal Rotation  PROM;5 reps;AAROM;10 reps    Flexion  PROM;5 reps;AAROM;10 reps    ABduction  PROM;5 reps;AAROM;10 reps      Shoulder Exercises: Standing   Protraction  AAROM;10 reps    Horizontal ABduction  AAROM;10 reps    External Rotation  AAROM;10 reps    Internal Rotation  AAROM;10 reps    Flexion  AAROM;10 reps    ABduction  AAROM;10 reps    Extension  Theraband;10 reps    Theraband Level (Shoulder Extension)  Level 2 (Red)    Row  Theraband;10 reps    Theraband Level (Shoulder Row)  Level 2 (Red)    Retraction  Theraband;10 reps    Theraband Level (Shoulder Retraction)  Level 2 (Red)      Shoulder Exercises: ROM/Strengthening   Wall Wash  1'    Proximal Shoulder Strengthening, Supine  10X; A/ROM no rest breaks    Proximal Shoulder Strengthening, Seated  10X; A/ROM no rest breaks      Manual Therapy   Manual Therapy  Soft tissue mobilization    Manual therapy comments  Manual therapy completed separately from exercises    Soft tissue mobilization  Myofascial release and manual stretching completed to right upper arm, trapezius, and scapularis region to increase joint mobility and decrease fascial restrictions.              OT Education - 06/19/18 1556    Education Details  AA/ROM shoulder exercises - standing    Person(s) Educated  Patient    Methods  Explanation;Demonstration;Handout    Comprehension  Verbalized understanding;Returned demonstration       OT Short Term Goals - 06/19/18 1558      OT SHORT TERM GOAL #1   Title  Patient will be educated and independent with HEP to faciliate her progress in therapy and allow her to return to using her RUE for daily and work related tasks.    Time  3    Period  Weeks    Status  Achieved      OT SHORT TERM GOAL #2   Title  Pt will increase P/ROM of RUE to [facility] to increase ability to complete upper body  dressing tasks with less difficulty.  Time  3    Period  Weeks    Status  Achieved      OT SHORT TERM GOAL #3   Title  Patient will increase RUE strength to 3/5 in order to complete simple daily tasks as waist level.    Time  3    Period  Weeks    Status  Achieved      OT SHORT TERM GOAL #4   Title  Patient will report a decrease in pain of approximately 5/10 when completing daily tasks with the RUE.     Time  3    Period  Weeks    Status  Achieved      OT SHORT TERM GOAL #5   Title  Pt will decrease RUE fascial restrictions to moderate amount in order to increase functional mobility needed to complete low level reaching tasks.     Time  3    Period  Weeks    Status  Achieved        OT Long Term Goals - 06/13/18 0931      OT LONG TERM GOAL #1   Title  Patient will return to highest level of independence while usinh her right UE for all daily and work related tasks.     Time  6    Period  Weeks    Status  On-going    Target Date  07/21/18      OT LONG TERM GOAL #2   Title  Pt will increase her RUE A/ROM to WNL to increase ability to complete overhead lifting tasks with less difficulty.     Time  6    Period  Weeks    Status  On-going      OT LONG TERM GOAL #3   Title  Pt will increase her RUE strength to 4+/5 in order to return to completing normal household lifting tasks and household chores with increased comfort and shoulder stability.     Time  6    Period  Weeks    Status  On-going      OT LONG TERM GOAL #4   Title  Pt will decrease fascial restrictions to minimum amount in order to increase functional mobility needed to complete overhead reaching tasks with less difficulty.     Time  6    Period  Weeks    Status  On-going      OT LONG TERM GOAL #5   Title  Pt will report a decrease in pain level of approximately 2/10 or less in order to complete all daily and work related tasks while using her RUE as her dominant extremity.     Time  6    Period  Weeks     Status  On-going            Plan - 06/19/18 1556    Clinical Impression Statement  A: Measurements taken at end of session for MD appointment tomorrow. patient is showing great improvement with passive and active ROM as she is able to progress to AA/ROM. VC for form and technique. Patient continues to have increased fascial restrictions in the anterior portion of the shoulder with tenderness noted during manual techniques. HEP was updated for AA/ROM.     Plan  P: Continue with AA/ROM exercises progressing to A/ROM as able to tolerate. Follow up on MD appointment.     Consulted and Agree with Plan of Care  Patient       Patient  will benefit from skilled therapeutic intervention in order to improve the following deficits and impairments:  Decreased range of motion, Pain, Impaired UE functional use, Increased fascial restrictions, Decreased strength  Visit Diagnosis: Chronic right shoulder pain  Stiffness of right shoulder, not elsewhere classified  Other symptoms and signs involving the musculoskeletal system    Problem List Patient Active Problem List   Diagnosis Date Noted  . 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 Reflux esophagitis 04/13/2012  . Esophageal reflux 04/13/2012   Limmie Patricia, OTR/L,CBIS  [phone]  06/19/2018, 3:59 PM  [facility] [address] [place], [place], [address] Phone: [phone]   Fax:  [phone]  Name: [name] MRN: [id number] Date of Birth: [birth year]

## 2018-06-19 NOTE — Patient Instructions (Signed)

## 2018-06-22 ENCOUNTER — Encounter (HOSPITAL_COMMUNITY): Payer: Self-pay | Admitting: Specialist

## 2018-06-22 ENCOUNTER — Ambulatory Visit (HOSPITAL_COMMUNITY): Payer: 59 | Attending: Orthopedic Surgery | Admitting: Specialist

## 2018-06-22 DIAGNOSIS — M25611 Stiffness of right shoulder, not elsewhere classified: Secondary | ICD-10-CM | POA: Insufficient documentation

## 2018-06-22 DIAGNOSIS — M25511 Pain in right shoulder: Secondary | ICD-10-CM | POA: Diagnosis not present

## 2018-06-22 DIAGNOSIS — R29898 Other symptoms and signs involving the musculoskeletal system: Secondary | ICD-10-CM | POA: Insufficient documentation

## 2018-06-22 DIAGNOSIS — G8929 Other chronic pain: Secondary | ICD-10-CM | POA: Insufficient documentation

## 2018-06-22 NOTE — Therapy (Signed)
[facility] [address] [place], [place], [address] Phone: [phone]   Fax:  [phone]  Occupational Therapy Treatment  Patient Details  Name: [name] MRN: [id number] Date of Birth: 15-Dec-1965 Referring Provider (OT): Duwayne Heck, MD   Encounter Date: 06/22/2018  OT End of Session - 06/22/18 1453    Visit Number  4    Number of Visits  12    Date for OT Re-Evaluation  07/22/18   mini reassessment on 07/07/18   Authorization Type  AETNA     Authorization Time Period  100 visits per year PT/OT combined 0 used    Authorization - Visit Number  4    Authorization - Number of Visits  100    OT Start Time  1436    OT Stop Time  1515    OT Time Calculation (min)  39 min    Activity Tolerance  Patient tolerated treatment well    Behavior During Therapy  [facility] for tasks assessed/performed       Past Medical History:  Diagnosis Date  . Asthma   . Endometriosis   . Eosinophilic esophagitis   . Feline esophagus   . GERD (gastroesophageal reflux disease)   . HSV-2 (herpes simplex virus 2) infection   . Hyperlipidemia   . Psoriasis   . Ulcerative colitis [facility])     Past Surgical History:  Procedure Laterality Date  . ABDOMINAL HYSTERECTOMY  11/2010  . COLONOSCOPY  04/13/2012   Procedure: COLONOSCOPY;  Surgeon: Hart Carwin, MD;  Location: [facility];  Service: Endoscopy;  Laterality: N/A;  . ESOPHAGOGASTRODUODENOSCOPY  04/13/2012   Procedure: ESOPHAGOGASTRODUODENOSCOPY (EGD);  Surgeon: Hart Carwin, MD;  [facility];  Service: Endoscopy;  Laterality: N/A;  . LAPAROSCOPY  1990   For endometriosis   . TUBAL LIGATION  1999    There were no vitals filed for this visit.  Subjective Assessment - 06/22/18 1436    Currently in Pain?  Yes    Pain Score  2     Pain Location  Shoulder    Pain Orientation  Right    Pain Descriptors / Indicators  Aching    Pain Type  Acute pain         [facility] OT Assessment -  06/22/18 0001      Assessment   Medical Diagnosis  right shoulder arthroscopy    Referring Provider (OT)  Duwayne Heck, MD               OT Treatments/Exercises (OP) - 06/22/18 0001      Shoulder Exercises: Supine   Protraction  PROM;5 reps;AAROM;12 reps    Horizontal ABduction  PROM;5 reps;AAROM;12 reps    External Rotation  PROM;5 reps;AAROM;12 reps    Internal Rotation  PROM;5 reps;AAROM;12 reps    Flexion  PROM;5 reps;AAROM;12 reps    ABduction  PROM;5 reps;AAROM;12 reps      Shoulder Exercises: Standing   Protraction  AAROM;12 reps    Horizontal ABduction  AAROM;12 reps    External Rotation  AAROM;12 reps    Internal Rotation  AAROM;12 reps    Flexion  AAROM;12 reps    ABduction  AAROM;12 reps    Extension  Theraband;15 reps    Theraband Level (Shoulder Extension)  Level 2 (Red)    Row  Theraband;15 reps    Theraband Level (Shoulder Row)  Level 2 (Red)    Retraction  Theraband;15 reps    Theraband Level (  Shoulder Retraction)  Level 2 (Red)      Shoulder Exercises: Therapy Ball   Flexion  15 reps    ABduction  15 reps    Right/Left  5 reps    Right/Left Limitations  with min difficulty for back       Shoulder Exercises: ROM/Strengthening   Wall Wash  1'      Manual Therapy   Manual Therapy  Myofascial release    Manual therapy comments  Manual therapy completed separately from exercises    Myofascial Release  myofascial release and manual stretching to right upper arm, scapular, and shoulder region to decrease pain and restrictions and improve pain free mobility needed for return to use of right arm as dominant with daily tasks.                OT Short Term Goals - 06/19/18 1558      OT SHORT TERM GOAL #1   Title  Patient will be educated and independent with HEP to faciliate her progress in therapy and allow her to return to using her RUE for daily and work related tasks.    Time  3    Period  Weeks    Status  Achieved      OT SHORT TERM  GOAL #2   Title  Pt will [facility] to increase ability to complete upper body dressing tasks with less difficulty.     Time  3    Period  Weeks    Status  Achieved      OT SHORT TERM GOAL #3   Title  Patient will increase RUE strength to 3/5 in order to complete simple daily tasks as waist level.    Time  3    Period  Weeks    Status  Achieved      OT SHORT TERM GOAL #4   Title  Patient will report a decrease in pain of approximately 5/10 when completing daily tasks with the RUE.     Time  3    Period  Weeks    Status  Achieved      OT SHORT TERM GOAL #5   Title  Pt will decrease RUE fascial restrictions to moderate amount in order to increase functional mobility needed to complete low level reaching tasks.     Time  3    Period  Weeks    Status  Achieved        OT Long Term Goals - 06/13/18 0931      OT LONG TERM GOAL #1   Title  Patient will return to highest level of independence while usinh her right UE for all daily and work related tasks.     Time  6    Period  Weeks    Status  On-going    Target Date  07/21/18      OT LONG TERM GOAL #2   Title  Pt will increase her RUE A/ROM to WNL to increase ability to complete overhead lifting tasks with less difficulty.     Time  6    Period  Weeks    Status  On-going      OT LONG TERM GOAL #3   Title  Pt will increase her RUE strength to 4+/5 in order to return to completing normal household lifting tasks and household chores with increased comfort and shoulder stability.     Time  6    Period  Weeks    Status  On-going      OT LONG TERM GOAL #4   Title  Pt will decrease fascial restrictions to minimum amount in order to increase functional mobility needed to complete overhead reaching tasks with less difficulty.     Time  6    Period  Weeks    Status  On-going      OT LONG TERM GOAL #5   Title  Pt will report a decrease in pain level of approximately 2/10 or less in order to complete all daily and  work related tasks while using her RUE as her dominant extremity.     Time  6    Period  Weeks    Status  On-going            Plan - 06/22/18 1516    Clinical Impression Statement  A:  continued improvements noted with P/ROM in supine and seated, with near full aa/rom in both positions noted.  less cuing required for form and technique.  added ball circles with min pa required for posterior reach.  patient does continue to have tenderness in her anterior shoulder.    Plan  P:  Attempt A/ROM in supine.  Add external and internal rotation with theraband.  add proximal shoulder strengthening in supine.        Patient will benefit from skilled therapeutic intervention in order to improve the following deficits and impairments:  Decreased range of motion, Pain, Impaired UE functional use, Increased fascial restrictions, Decreased strength  Visit Diagnosis: Chronic right shoulder pain  Stiffness of right shoulder, not elsewhere classified  Other symptoms and signs involving the musculoskeletal system    Problem List Patient Active Problem List   Diagnosis Date Noted  . 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 Reflux esophagitis 04/13/2012  . Esophageal reflux 04/13/2012    Shirlean Mylar, MHA, OTR/L [phone]  06/22/2018, 3:19 PM  [place] [facility] [address] [place], [place], [address] Phone: [phone]   Fax:  [phone]  Name: [name] MRN: [id number] Date of Birth: Apr 26, 1966

## 2018-06-26 ENCOUNTER — Ambulatory Visit (HOSPITAL_COMMUNITY): Payer: 59 | Admitting: Occupational Therapy

## 2018-06-26 ENCOUNTER — Telehealth (HOSPITAL_COMMUNITY): Payer: Self-pay | Admitting: Occupational Therapy

## 2018-06-26 ENCOUNTER — Encounter (HOSPITAL_COMMUNITY): Payer: Self-pay | Admitting: Occupational Therapy

## 2018-06-26 DIAGNOSIS — M25511 Pain in right shoulder: Principal | ICD-10-CM

## 2018-06-26 DIAGNOSIS — G8929 Other chronic pain: Secondary | ICD-10-CM

## 2018-06-26 DIAGNOSIS — M25611 Stiffness of right shoulder, not elsewhere classified: Secondary | ICD-10-CM

## 2018-06-26 DIAGNOSIS — R29898 Other symptoms and signs involving the musculoskeletal system: Secondary | ICD-10-CM

## 2018-06-26 NOTE — Telephone Encounter (Signed)
Pt understand 2020 benefits NF 06/26/2018  [employer] 25.00 due toward bill --- Pt responsibe for bill until Deduct is met  Deduct Fam 3000-0  applied Co ins- 80/20%  Fam OOP 6850- 0 applied visit- medical necessity No Francella Solian to [facility] DPT#[id number]  NF 06/22/2018

## 2018-06-26 NOTE — Therapy (Signed)
[facility] [address] [place], [place], [address] Phone: [phone]   Fax:  [phone]  Occupational Therapy Treatment  Patient Details  Name: [name] MRN: [id number] Date of Birth: 09/19/1965 Referring Provider (OT): Duwayne HeckJason Rogers, MD   Encounter Date: 06/26/2018  OT End of Session - 06/26/18 1511    Visit Number  5    Number of Visits  12    Date for OT Re-Evaluation  07/22/18   mini reassessment on 07/07/18   Authorization Type  AETNA     Authorization Time Period  100 visits per year PT/OT combined 0 used    Authorization - Visit Number  5    Authorization - Number of Visits  100    OT Start Time  1432    OT Stop Time  1510    OT Time Calculation (min)  38 min    Activity Tolerance  Patient tolerated treatment well    Behavior During Therapy  [facility] for tasks assessed/performed       Past Medical History:  Diagnosis Date  . Asthma   . Endometriosis   . Eosinophilic esophagitis   . Feline esophagus   . GERD (gastroesophageal reflux disease)   . HSV-2 (herpes simplex virus 2) infection   . Hyperlipidemia   . Psoriasis   . Ulcerative colitis [facility](HCC)     Past Surgical History:  Procedure Laterality Date  . ABDOMINAL HYSTERECTOMY  11/2010  . COLONOSCOPY  04/13/2012   Procedure: COLONOSCOPY;  Surgeon: Hart Carwinora M Brodie, MD;  Location: [facility];  Service: Endoscopy;  Laterality: N/A;  . ESOPHAGOGASTRODUODENOSCOPY  04/13/2012   Procedure: ESOPHAGOGASTRODUODENOSCOPY (EGD);  Surgeon: Hart Carwinora M Brodie, MD;  [facility];  Service: Endoscopy;  Laterality: N/A;  . LAPAROSCOPY  1990   For endometriosis   . TUBAL LIGATION  1999    There were no vitals filed for this visit.  Subjective Assessment - 06/26/18 1430    Subjective   S: Now it's bothering me in the back of my shoulder more.     Currently in Pain?  Yes    Pain Score  2     Pain Location  Shoulder    Pain Orientation  Right    Pain Descriptors /  Indicators  Aching    Pain Type  Acute pain    Pain Radiating Towards  N/A    Pain Onset  In the past 7 days    Pain Frequency  Intermittent    Aggravating Factors   movement    Pain Relieving Factors  rest, pain medication    Effect of Pain on Daily Activities  Max effect on ADLs using RUE         [facility] - 06/26/18 1429      Assessment   Medical Diagnosis  right shoulder arthroscopy    Referring Provider (OT)  Duwayne HeckJason Rogers, MD      Precautions   Precautions  Shoulder    Type of Shoulder Precautions  Basic protocol: Complete P/ROM/AA/ROM (week 1-3) Once patient has full P/ROM progress to phase II (week 4-5 07/10/18-07/17/18) Progress to A/ROM, avoid repetitive overhead activities. Week 7-8 ( 07/24/18-07/31/18) begin strengthening.    Shoulder Interventions  Shoulder sling/immobilizer               OT Treatments/Exercises (OP) - 06/26/18 1435      Exercises   Exercises  Shoulder      Shoulder Exercises: Supine  Protraction  PROM;5 reps;AAROM;15 reps    Horizontal ABduction  PROM;5 reps;AAROM;15 reps    External Rotation  PROM;5 reps;AAROM;15 reps    Internal Rotation  PROM;5 reps;AAROM;15 reps    Flexion  PROM;5 reps;AAROM;15 reps    ABduction  PROM;5 reps;AAROM;15 reps      Shoulder Exercises: Standing   Protraction  AAROM;15 reps    Horizontal ABduction  AAROM;15 reps    External Rotation  AAROM;15 reps    Internal Rotation  AAROM;15 reps    Flexion  AAROM;15 reps    ABduction  AAROM;15 reps    Extension  Theraband;15 reps    Theraband Level (Shoulder Extension)  Level 2 (Red)    Row  Theraband;15 reps    Theraband Level (Shoulder Row)  Level 2 (Red)    Retraction  Theraband;15 reps    Theraband Level (Shoulder Retraction)  Level 2 (Red)      Shoulder Exercises: Pulleys   Flexion  1 minute    ABduction  1 minute      Shoulder Exercises: ROM/Strengthening   Proximal Shoulder Strengthening, Supine  10X; A/ROM no rest breaks    Proximal  Shoulder Strengthening, Seated  10X; A/ROM no rest breaks    Other ROM/Strengthening Exercises  proximal shoulder strengthening on doorway with washcloth, 1'      Manual Therapy   Manual Therapy  Myofascial release    Manual therapy comments  Manual therapy completed separately from exercises    Soft tissue mobilization  Myofascial release and manual stretching completed to right upper arm, trapezius, and scapularis region to increase joint mobility and decrease fascial restrictions.                OT Short Term Goals - 06/19/18 1558      OT SHORT TERM GOAL #1   Title  Patient will be educated and independent with HEP to faciliate her progress in therapy and allow her to return to using her RUE for daily and work related tasks.    Time  3    Period  Weeks    Status  Achieved      OT SHORT TERM GOAL #2   Title  Pt will increase P[facility] to [facility] to increase ability to complete upper body dressing tasks with less difficulty.     Time  3    Period  Weeks    Status  Achieved      OT SHORT TERM GOAL #3   Title  Patient will increase RUE strength to 3/5 in order to complete simple daily tasks as waist level.    Time  3    Period  Weeks    Status  Achieved      OT SHORT TERM GOAL #4   Title  Patient will report a decrease in pain of approximately 5/10 when completing daily tasks with the RUE.     Time  3    Period  Weeks    Status  Achieved      OT SHORT TERM GOAL #5   Title  Pt will decrease RUE fascial restrictions to moderate amount in order to increase functional mobility needed to complete low level reaching tasks.     Time  3    Period  Weeks    Status  Achieved        OT Long Term Goals - 06/13/18 0931      OT LONG TERM GOAL #1   Title  Patient will  return to highest level of independence while usinh her right UE for all daily and work related tasks.     Time  6    Period  Weeks    Status  On-going    Target Date  07/21/18      OT LONG TERM GOAL #2    Title  Pt will increase her RUE A/ROM to WNL to increase ability to complete overhead lifting tasks with less difficulty.     Time  6    Period  Weeks    Status  On-going      OT LONG TERM GOAL #3   Title  Pt will increase her RUE strength to 4+/5 in order to return to completing normal household lifting tasks and household chores with increased comfort and shoulder stability.     Time  6    Period  Weeks    Status  On-going      OT LONG TERM GOAL #4   Title  Pt will decrease fascial restrictions to minimum amount in order to increase functional mobility needed to complete overhead reaching tasks with less difficulty.     Time  6    Period  Weeks    Status  On-going      OT LONG TERM GOAL #5   Title  Pt will report a decrease in pain level of approximately 2/10 or less in order to complete all daily and work related tasks while using her RUE as her dominant extremity.     Time  6    Period  Weeks    Status  On-going            Plan - 06/26/18 1511    Clinical Impression Statement  A: Continued with manual therapy to address fascial restrictions along right upper arm, trapezius, and scapularis regions. Continued with AA/ROM increasing repetitions to 15 today. Added proximal shoulder strengthening on door with mod fatigue at end of task. Also added pulleys. Verbal cuing for form and technique and to slow down and activate correct muscles versus using momentum.     Plan  P: Continue with AA/ROM focusing on correct form. Add PVC pipe slide       Patient will benefit from skilled therapeutic intervention in order to improve the following deficits and impairments:  Decreased range of motion, Pain, Impaired UE functional use, Increased fascial restrictions, Decreased strength  Visit Diagnosis: Chronic right shoulder pain  Stiffness of right shoulder, not elsewhere classified  Other symptoms and signs involving the musculoskeletal system    Problem List Patient Active Problem  List   Diagnosis Date Noted  . 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 Reflux esophagitis 04/13/2012  . Esophageal reflux 04/13/2012   Ezra Sites, OTR/L  [phone] 06/26/2018, 3:13 PM  [facility] [address] [place], [place], [address] Phone: [phone]   Fax:  [phone]  Name: [name] MRN: [id number] Date of Birth: 1965/08/07

## 2018-06-29 ENCOUNTER — Encounter (HOSPITAL_COMMUNITY): Payer: Self-pay

## 2018-06-29 ENCOUNTER — Ambulatory Visit (HOSPITAL_COMMUNITY): Payer: 59

## 2018-06-29 DIAGNOSIS — M25511 Pain in right shoulder: Secondary | ICD-10-CM | POA: Diagnosis not present

## 2018-06-29 DIAGNOSIS — R29898 Other symptoms and signs involving the musculoskeletal system: Secondary | ICD-10-CM

## 2018-06-29 DIAGNOSIS — G8929 Other chronic pain: Secondary | ICD-10-CM

## 2018-06-29 DIAGNOSIS — M25611 Stiffness of right shoulder, not elsewhere classified: Secondary | ICD-10-CM

## 2018-06-29 NOTE — Therapy (Signed)
[facility] [address] [place], [place], [address] Phone: [phone]   Fax:  [phone]  Occupational Therapy Treatment  Patient Details  Name: [name] MRN: [id number] Date of Birth: 11/29/1965 Referring Provider (OT): Duwayne Heck, MD   Encounter Date: 06/29/2018  OT End of Session - 06/29/18 1459    Visit Number  6    Number of Visits  12    Date for OT Re-Evaluation  07/22/18   mini reassessment on 07/07/18   Authorization Type  AETNA     Authorization Time Period  100 visits per year PT/OT combined 0 used    Authorization - Visit Number  1    Authorization - Number of Visits  100    OT Start Time  1436    OT Stop Time  1515    OT Time Calculation (min)  39 min    Activity Tolerance  Patient tolerated treatment well    Behavior During Therapy  [facility] for tasks assessed/performed       Past Medical History:  Diagnosis Date  . Asthma   . Endometriosis   . Eosinophilic esophagitis   . Feline esophagus   . GERD (gastroesophageal reflux disease)   . HSV-2 (herpes simplex virus 2) infection   . Hyperlipidemia   . Psoriasis   . Ulcerative colitis [facility])     Past Surgical History:  Procedure Laterality Date  . ABDOMINAL HYSTERECTOMY  11/2010  . COLONOSCOPY  04/13/2012   Procedure: COLONOSCOPY;  Surgeon: Hart Carwin, MD;  [facility]: [facility];  Service: Endoscopy;  Laterality: N/A;  . ESOPHAGOGASTRODUODENOSCOPY  04/13/2012   Procedure: ESOPHAGOGASTRODUODENOSCOPY (EGD);  Surgeon: Hart Carwin, MD;  [facility];  Service: Endoscopy;  Laterality: N/A;  . LAPAROSCOPY  1990   For endometriosis   . TUBAL LIGATION  1999    There were no vitals filed for this visit.  Subjective Assessment - 06/29/18 1458    Subjective   S: It'll hurt if I'm trying to pick up things.     Currently in Pain?  No/denies         [facility] OT Assessment - 06/29/18 1450      Assessment   Medical Diagnosis  right shoulder  arthroscopy    Referring Provider (OT)  Duwayne Heck, MD      Precautions   Precautions  Shoulder    Type of Shoulder Precautions  Basic protocol: Complete P/ROM/AA/ROM (week 1-3) Once patient has full P/ROM progress to phase II (week 4-5 (07/03/18-07/10/18) Progress to A/ROM, avoid repetitive overhead activities. Week 7-8 ( 07/24/18-07/31/18) begin strengthening.               OT Treatments/Exercises (OP) - 06/29/18 1451      Exercises   Exercises  Shoulder      Shoulder Exercises: Supine   Protraction  PROM;5 reps;AAROM;15 reps    Horizontal ABduction  PROM;5 reps;AAROM;15 reps    External Rotation  PROM;5 reps;AAROM;15 reps    Internal Rotation  PROM;5 reps;AAROM;15 reps    Flexion  PROM;5 reps;AAROM;15 reps    ABduction  PROM;5 reps;AAROM;15 reps      Shoulder Exercises: Standing   Protraction  AAROM;15 reps    Horizontal ABduction  AAROM;15 reps    External Rotation  AAROM;15 reps    Internal Rotation  AAROM;15 reps    Flexion  AAROM;15 reps    ABduction  AAROM;15 reps    Extension  Theraband;15 reps  Theraband Level (Shoulder Extension)  Level 2 (Red)    Row  Theraband;15 reps    Theraband Level (Shoulder Row)  Level 2 (Red)    Retraction  Theraband;15 reps    Theraband Level (Shoulder Retraction)  Level 2 (Red)      Shoulder Exercises: ROM/Strengthening   UBE (Upper Arm Bike)  Level 1 2' forward 2' reverse   pace: 4.0-4.5   Wall Wash  1'    Proximal Shoulder Strengthening, Supine  15X A/ROM no rest breaks    Proximal Shoulder Strengthening, Seated  15X; A/ROM no rest breaks      Manual Therapy   Manual Therapy  Myofascial release    Manual therapy comments  Manual therapy completed separately from exercises    Soft tissue mobilization  Myofascial release and manual stretching completed to right upper arm, trapezius, and scapularis region to increase joint mobility and decrease fascial restrictions.                OT Short Term Goals - 06/29/18  1628      OT SHORT TERM GOAL #1   Title  Patient will be educated and independent with HEP to faciliate her progress in therapy and allow her to return to using her RUE for daily and work related tasks.    Time  3    Period  Weeks      OT SHORT TERM GOAL #2   Title  Pt will increase P/ROM of RUE to [facility] to increase ability to complete upper body dressing tasks with less difficulty.     Time  3    Period  Weeks      OT SHORT TERM GOAL #3   Title  Patient will increase RUE strength to 3/5 in order to complete simple daily tasks as waist level.    Time  3    Period  Weeks      OT SHORT TERM GOAL #4   Title  Patient will report a decrease in pain of approximately 5/10 when completing daily tasks with the RUE.     Time  3    Period  Weeks      OT SHORT TERM GOAL #5   Title  Pt will decrease RUE fascial restrictions to moderate amount in order to increase functional mobility needed to complete low level reaching tasks.     Time  3    Period  Weeks        OT Long Term Goals - 06/29/18 1628      OT LONG TERM GOAL #1   Title  Patient will return to highest level of independence while usinh her right UE for all daily and work related tasks.     Time  6    Period  Weeks    Status  On-going      OT LONG TERM GOAL #2   Title  Pt will increase her RUE A/ROM to WNL to increase ability to complete overhead lifting tasks with less difficulty.     Time  6    Period  Weeks    Status  On-going      OT LONG TERM GOAL #3   Title  Pt will increase her RUE strength to 4+/5 in order to return to completing normal household lifting tasks and household chores with increased comfort and shoulder stability.     Time  6    Period  Weeks    Status  On-going  OT LONG TERM GOAL #4   Title  Pt will decrease fascial restrictions to minimum amount in order to increase functional mobility needed to complete overhead reaching tasks with less difficulty.     Time  6    Period  Weeks    Status   On-going      OT LONG TERM GOAL #5   Title  Pt will report a decrease in pain level of approximately 2/10 or less in order to complete all daily and work related tasks while using her RUE as her dominant extremity.     Time  6    Period  Weeks    Status  On-going            Plan - 06/29/18 1511    Clinical Impression Statement  A: Focused on form and technique during session. patient required min VC initially during session to complete correctly. Added UBE to focus on shoulder endurance and scapular stability.     Plan  P: Progress to A/ROM supine and standing. Update HEP.    Consulted and Agree with Plan of Care  Patient       Patient will benefit from skilled therapeutic intervention in order to improve the following deficits and impairments:  Decreased range of motion, Pain, Impaired UE functional use, Increased fascial restrictions, Decreased strength  Visit Diagnosis: Chronic right shoulder pain  Stiffness of right shoulder, not elsewhere classified  Other symptoms and signs involving the musculoskeletal system    Problem List Patient Active Problem List   Diagnosis Date Noted  . 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 Reflux esophagitis 04/13/2012  . Esophageal reflux 04/13/2012   Limmie PatriciaLaura Glender [place], OTR/L,CBIS  [phone]  06/29/2018, 4:29 PM  [facility] [address] [place], [place], [address] Phone: [phone]   Fax:  [phone]  Name: [name] MRN: [id number] Date of Birth: 12/31/1965

## 2018-07-03 ENCOUNTER — Encounter (HOSPITAL_COMMUNITY): Payer: 59

## 2018-07-05 ENCOUNTER — Telehealth (HOSPITAL_COMMUNITY): Payer: Self-pay | Admitting: Family Medicine

## 2018-07-05 ENCOUNTER — Ambulatory Visit (HOSPITAL_COMMUNITY): Payer: 59

## 2018-07-05 NOTE — Telephone Encounter (Signed)
07/05/18  pt called to cx and we rescheduled for 1/21 for her - no reason was given

## 2018-07-07 ENCOUNTER — Ambulatory Visit (HOSPITAL_COMMUNITY): Payer: 59 | Admitting: Specialist

## 2018-07-07 DIAGNOSIS — G8929 Other chronic pain: Secondary | ICD-10-CM

## 2018-07-07 DIAGNOSIS — M25611 Stiffness of right shoulder, not elsewhere classified: Secondary | ICD-10-CM

## 2018-07-07 DIAGNOSIS — R29898 Other symptoms and signs involving the musculoskeletal system: Secondary | ICD-10-CM

## 2018-07-07 DIAGNOSIS — M25511 Pain in right shoulder: Principal | ICD-10-CM

## 2018-07-07 NOTE — Therapy (Signed)
[place], [place], [address] Phone: [phone]   Fax:  [phone]  Occupational Therapy Treatment  Patient Details  Name: [name] MRN: [id number] Date of Birth: 04-Sep-1965 Referring Provider (OT): Victorino December, MD   Encounter Date: 07/07/2018  OT End of Session - 07/07/18 1100    Visit Number  7    Number of Visits  12    Date for OT Re-Evaluation  07/22/18    Authorization Type  AETNA     Authorization Time Period  100 visits per year PT/OT combined 0 used    Authorization - Visit Number  2    Authorization - Number of Visits  100    OT Start Time  [phone]    OT Stop Time  1040    OT Time Calculation (min)  48 min    Activity Tolerance  Patient tolerated treatment well    Behavior During Therapy  [facility] for tasks assessed/performed       Past Medical History:  Diagnosis Date  . Asthma   . Endometriosis   . Eosinophilic esophagitis   . Feline esophagus   . GERD (gastroesophageal reflux disease)   . HSV-2 (herpes simplex virus 2) infection   . Hyperlipidemia   . Psoriasis   . Ulcerative colitis [facility])     Past Surgical History:  Procedure Laterality Date  . ABDOMINAL HYSTERECTOMY  11/2010  . COLONOSCOPY  04/13/2012   Procedure: COLONOSCOPY;  Surgeon: Lafayette Dragon, MD;  [facility];  Service: Endoscopy;  Laterality: N/A;  . ESOPHAGOGASTRODUODENOSCOPY  04/13/2012   Procedure: ESOPHAGOGASTRODUODENOSCOPY (EGD);  Surgeon: Lafayette Dragon, MD;  Location[facility];  Service: Endoscopy;  Laterality: N/A;  . LAPAROSCOPY  1990   For endometriosis   . TUBAL LIGATION  1999    There were no vitals filed for this visit.      [facility] OT Assessment - 07/07/18 0001      Assessment   Medical Diagnosis  right shoulder arthroscopy      Precautions   Precautions  Shoulder      ADL   ADL comments  patient states she can complete all desired daily activities, reaching overhead is still  difficult if she has any dish or other item in her hand, has not returned to work       Observation/Other Assessments   Focus on Therapeutic Outcomes (FOTO)   61% independent      AROM   Overall AROM Comments  Assessed standing with IR/er abducted. A/ROM assessed for the first time today.    Right Shoulder Flexion  151 Degrees   125   Right Shoulder ABduction  150 Degrees   125   Right Shoulder Internal Rotation  90 Degrees   90   Right Shoulder External Rotation  80 Degrees   85     Strength   Overall Strength Comments  Assessed standing. IR/er abducted. Assessed for the first time this session.     Right Shoulder Flexion  4+/5   3/5   Right Shoulder ABduction  4+/5   3/5   Right Shoulder Internal Rotation  4-/5   3/5   Right Shoulder External Rotation  4-/5   3/5              OT Treatments/Exercises (OP) - 07/07/18 0001      Shoulder Exercises: Supine   Protraction  PROM;5 reps;AROM;10 reps    Horizontal ABduction  PROM;5  reps;AROM;10 reps    External Rotation  PROM;5 reps;AROM;10 reps    Internal Rotation  PROM;5 reps;AROM;10 reps    Flexion  PROM;5 reps;AROM;10 reps    ABduction  PROM;5 reps;AROM;10 reps      Shoulder Exercises: Standing   Protraction  AROM;10 reps    Horizontal ABduction  AROM;10 reps    External Rotation  AROM;10 reps   with shoulder abducted to 90    Internal Rotation  AROM;10 reps   with shoulder abducted to 90    Flexion  AROM;10 reps    ABduction  AROM;10 reps    Extension  Theraband;15 reps    Theraband Level (Shoulder Extension)  Level 3 (Green)    Row  [employer] reps    Theraband Level (Shoulder Row)  Level 3 (Green)    Retraction  Theraband;15 reps    Theraband Level (Shoulder Retraction)  Level 3 (Green)    Other Standing Exercises  theraband wall walk with red theraband 5 times       Shoulder Exercises: ROM/Strengthening   UBE (Upper Arm Bike)  level 2 3' in reverse for improved scapular stability and proximal  shoulder strength     Ball on Wall  1' flexed and 1' abducted with verbal cuing for aligment and to provide more input to ball - completed for improved shoudler stablitzation       Manual Therapy   Manual Therapy  Myofascial release    Manual therapy comments  Manual therapy completed separately from exercises    Soft tissue mobilization  Myofascial release and manual stretching completed to right upper arm, trapezius, and scapularis region to increase joint mobility and decrease fascial restrictions.              OT Education - 07/07/18 1059    Education Details  recommended moist heat to anterior right shoulder     Person(s) Educated  Patient    Methods  Explanation;Demonstration    Comprehension  Verbalized understanding;Returned demonstration       OT Short Term Goals - 07/07/18 1017      OT SHORT TERM GOAL #1   Title  Patient will be educated and independent with HEP to faciliate her progress in therapy and allow her to return to using her RUE for daily and work related tasks.    Time  3    Period  Weeks      OT SHORT TERM GOAL #2   Title  Pt will increase P/ROM of RUE to [facility] to increase ability to complete upper body dressing tasks with less difficulty.     Time  3    Period  Weeks      OT SHORT TERM GOAL #3   Title  Patient will increase RUE strength to 3/5 in order to complete simple daily tasks as waist level.    Time  3    Period  Weeks      OT SHORT TERM GOAL #4   Title  Patient will report a decrease in pain of approximately 5/10 when completing daily tasks with the RUE.     Time  3    Period  Weeks      OT SHORT TERM GOAL #5   Title  Pt will decrease RUE fascial restrictions to moderate amount in order to increase functional mobility needed to complete low level reaching tasks.     Time  3    Period  Weeks  OT Long Term Goals - 07/07/18 1102      OT LONG TERM GOAL #1   Title  Patient will return to highest level of independence while usinh  her right UE for all daily and work related tasks.     Time  6    Period  Weeks    Status  Achieved      OT LONG TERM GOAL #2   Title  Pt will increase her RUE A/ROM to WNL to increase ability to complete overhead lifting tasks with less difficulty.     Time  6    Period  Weeks    Status  Achieved      OT LONG TERM GOAL #3   Title  Pt will increase her RUE strength to 4+/5 in order to return to completing normal household lifting tasks and household chores with increased comfort and shoulder stability.     Time  6    Period  Weeks    Status  Partially Met      OT LONG TERM GOAL #4   Title  Pt will decrease fascial restrictions to minimum amount in order to increase functional mobility needed to complete overhead reaching tasks with less difficulty.     Time  6    Period  Weeks    Status  Achieved      OT LONG TERM GOAL #5   Title  Pt will report a decrease in pain level of approximately 2/10 or less in order to complete all daily and work related tasks while using her RUE as her dominant extremity.     Time  6    Period  Weeks    Status  Achieved            Plan - 07/07/18 1100    Clinical Impression Statement  A:  Patient is making excellent progress towards her goals in occupational therapy.  Her remaining deficits are sustained strength for overhead reaching and lifting of functional items such as dishware and binders at work.  Patient will benefit from skilled OT intervention to continue to improve proximal shoulder strength and ease of end range reaching.     Plan  P:  focus on functional strengthening and end range reaching. update HEP, follow up on moist heat.        Patient will benefit from skilled therapeutic intervention in order to improve the following deficits and impairments:  Decreased range of motion, Pain, Impaired UE functional use, Increased fascial restrictions, Decreased strength  Visit Diagnosis: Chronic right shoulder pain  Stiffness of right  shoulder, not elsewhere classified  Other symptoms and signs involving the musculoskeletal system    Problem List Patient Active Problem List   Diagnosis Date Noted  . 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 Reflux esophagitis 04/13/2012  . Esophageal reflux 04/13/2012    Vangie Bicker, [place], OTR/L [phone]  07/07/2018, 11:03 AM  [facility] [address] [place], [place], [address] Phone: [phone]   Fax:  [phone]  Name: [name] MRN: [id number] Date of Birth: June 05, 1966

## 2018-07-11 ENCOUNTER — Encounter (HOSPITAL_COMMUNITY): Payer: Self-pay | Admitting: Occupational Therapy

## 2018-07-11 ENCOUNTER — Ambulatory Visit (HOSPITAL_COMMUNITY): Payer: 59 | Admitting: Occupational Therapy

## 2018-07-11 DIAGNOSIS — G8929 Other chronic pain: Secondary | ICD-10-CM

## 2018-07-11 DIAGNOSIS — M25511 Pain in right shoulder: Secondary | ICD-10-CM | POA: Diagnosis not present

## 2018-07-11 DIAGNOSIS — R29898 Other symptoms and signs involving the musculoskeletal system: Secondary | ICD-10-CM

## 2018-07-11 DIAGNOSIS — M25611 Stiffness of right shoulder, not elsewhere classified: Secondary | ICD-10-CM

## 2018-07-11 NOTE — Therapy (Signed)
[place], [place], [address] Phone: [phone]   Fax:  [phone]  Occupational Therapy Treatment  Patient Details  Name: [name] MRN: [id number] Date of Birth: May 04, 1966 Referring Provider (OT): Victorino December, MD   Encounter Date: 07/11/2018  OT End of Session - 07/11/18 1555    Visit Number  8    Number of Visits  12    Date for OT Re-Evaluation  07/22/18    Authorization Type  AETNA     Authorization Time Period  100 visits per year PT/OT combined 0 used    Authorization - Visit Number  3    Authorization - Number of Visits  100    OT Start Time  0938    OT Stop Time  1559    OT Time Calculation (min)  42 min    Activity Tolerance  Patient tolerated treatment well    Behavior During Therapy  [facility] for tasks assessed/performed       Past Medical History:  Diagnosis Date  . Asthma   . Endometriosis   . Eosinophilic esophagitis   . Feline esophagus   . GERD (gastroesophageal reflux disease)   . HSV-2 (herpes simplex virus 2) infection   . Hyperlipidemia   . Psoriasis   . Ulcerative colitis [facility])     Past Surgical History:  Procedure Laterality Date  . ABDOMINAL HYSTERECTOMY  11/2010  . COLONOSCOPY  04/13/2012   Procedure: COLONOSCOPY;  Surgeon: Lafayette Dragon, MD;  Location: [facility];  Service: Endoscopy;  Laterality: N/A;  . ESOPHAGOGASTRODUODENOSCOPY  04/13/2012   Procedure: ESOPHAGOGASTRODUODENOSCOPY (EGD);  Surgeon: Lafayette Dragon, MD;  Location: [facility];  Service: Endoscopy;  Laterality: N/A;  . LAPAROSCOPY  1990   For endometriosis   . TUBAL LIGATION  1999    There were no vitals filed for this visit.  Subjective Assessment - 07/11/18 1516    Subjective   S: It was really sore yesterday, I think it was the weather.     Currently in Pain?  Yes    Pain Score  3     Pain Location  Shoulder    Pain Orientation  Right    Pain Descriptors / Indicators  Aching;Sore    Pain  Type  Acute pain    Pain Radiating Towards  N/A    Pain Onset  In the past 7 days    Pain Frequency  Intermittent    Aggravating Factors   movement    Pain Relieving Factors  rest, pain medication    Effect of Pain on Daily Activities  min/mod effect on ADLs with RUE    Multiple Pain Sites  No         [facility] OT Assessment - 07/11/18 1515      Assessment   Medical Diagnosis  right shoulder arthroscopy      Precautions   Precautions  Shoulder    Type of Shoulder Precautions  Basic protocol: Complete P/ROM/AA/ROM (week 1-3) Once patient has full P/ROM progress to phase II (week 4-5 (07/03/18-07/10/18) Progress to A/ROM, avoid repetitive overhead activities. Week 7-8 ( 07/24/18-07/31/18) begin strengthening.               OT Treatments/Exercises (OP) - 07/11/18 1516      Exercises   Exercises  Shoulder      Shoulder Exercises: Supine   Protraction  PROM;5 reps;AROM;10 reps    Horizontal ABduction  PROM;5 reps;AROM;10 reps  External Rotation  PROM;5 reps;AROM;10 reps    Internal Rotation  PROM;5 reps;AROM;10 reps    Flexion  PROM;5 reps;AROM;10 reps    ABduction  PROM;5 reps;AROM;10 reps      Shoulder Exercises: Standing   Protraction  AROM;10 reps    Horizontal ABduction  AROM;10 reps    External Rotation  AROM;10 reps   abducted   Internal Rotation  AROM;10 reps   abducted   Flexion  AROM;10 reps    ABduction  AROM;10 reps    Extension  Theraband;15 reps    Theraband Level (Shoulder Extension)  Level 3 (Green)    Row  [employer] reps    Theraband Level (Shoulder Row)  Level 3 (Green)    Retraction  Theraband;15 reps    Theraband Level (Shoulder Retraction)  Level 3 (Green)      Shoulder Exercises: ROM/Strengthening   UBE (Upper Arm Bike)  level 2 3' in reverse for improved scapular stability and proximal shoulder strength    pace: 7.5   Over Head Lace  1' seated    "W" Arms  10X A/ROM    X to V Arms  10X A/ROM    Proximal Shoulder Strengthening, Supine   15X A/ROM no rest breaks    Proximal Shoulder Strengthening, Seated  15X A/ROM no rest breaks    Ball on Wall  1' flexion 1' abduction      Manual Therapy   Manual Therapy  Myofascial release    Manual therapy comments  Manual therapy completed separately from exercises    Soft tissue mobilization  Myofascial release and manual stretching completed to right upper arm, trapezius, and scapularis region to increase joint mobility and decrease fascial restrictions.                OT Short Term Goals - 07/07/18 1017      OT SHORT TERM GOAL #1   Title  Patient will be educated and independent with HEP to faciliate her progress in therapy and allow her to return to using her RUE for daily and work related tasks.    Time  3    Period  Weeks      OT SHORT TERM GOAL #2   Title  Pt will increase P/ROM of RUE to [facility] to increase ability to complete upper body dressing tasks with less difficulty.     Time  3    Period  Weeks      OT SHORT TERM GOAL #3   Title  Patient will increase RUE strength to 3/5 in order to complete simple daily tasks as waist level.    Time  3    Period  Weeks      OT SHORT TERM GOAL #4   Title  Patient will report a decrease in pain of approximately 5/10 when completing daily tasks with the RUE.     Time  3    Period  Weeks      OT SHORT TERM GOAL #5   Title  Pt will decrease RUE fascial restrictions to moderate amount in order to increase functional mobility needed to complete low level reaching tasks.     Time  3    Period  Weeks        OT Long Term Goals - 07/07/18 1102      OT LONG TERM GOAL #1   Title  Patient will return to highest level of independence while usinh her right UE for all daily and work  related tasks.     Time  6    Period  Weeks    Status  Achieved      OT LONG TERM GOAL #2   Title  Pt will increase her RUE A/ROM to WNL to increase ability to complete overhead lifting tasks with less difficulty.     Time  6    Period  Weeks     Status  Achieved      OT LONG TERM GOAL #3   Title  Pt will increase her RUE strength to 4+/5 in order to return to completing normal household lifting tasks and household chores with increased comfort and shoulder stability.     Time  6    Period  Weeks    Status  Partially Met      OT LONG TERM GOAL #4   Title  Pt will decrease fascial restrictions to minimum amount in order to increase functional mobility needed to complete overhead reaching tasks with less difficulty.     Time  6    Period  Weeks    Status  Achieved      OT LONG TERM GOAL #5   Title  Pt will report a decrease in pain level of approximately 2/10 or less in order to complete all daily and work related tasks while using her RUE as her dominant extremity.     Time  6    Period  Weeks    Status  Achieved            Plan - 07/11/18 1535    Clinical Impression Statement  A: Pt reports heat helped the pain a little bit over the weekend. Small muscle knot palpated in anterior deltoid, manual therapy completed to address along with fascial restrictions in upper arm and trapezius. Continued with A/ROM focusing on end range stretch and reaching. Added x to v arms, w arms, and overhead lacing. Verbal cuing for form and technique during exercises.     Plan  P: complete overhead lacing in standing, complete sidelying A/ROM vs supine for improved scapular stability    Consulted and Agree with Plan of Care  Patient       Patient will benefit from skilled therapeutic intervention in order to improve the following deficits and impairments:  Decreased range of motion, Pain, Impaired UE functional use, Increased fascial restrictions, Decreased strength  Visit Diagnosis: Chronic right shoulder pain  Stiffness of right shoulder, not elsewhere classified  Other symptoms and signs involving the musculoskeletal system    Problem List Patient Active Problem List   Diagnosis Date Noted  . 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 Reflux esophagitis 04/13/2012  . Esophageal reflux 04/13/2012   Guadelupe Sabin, OTR/L  [phone] 07/11/2018, 3:59 PM  [place], [place], [address] Phone: [phone]   Fax:  [phone]  Name: KYRIN GARN MRN: [id number] Date of Birth: Oct 29, 1965

## 2018-07-12 ENCOUNTER — Telehealth (HOSPITAL_COMMUNITY): Payer: Self-pay | Admitting: Family Medicine

## 2018-07-12 NOTE — Telephone Encounter (Signed)
07/12/18  pt called to cx but no reason was given

## 2018-07-13 ENCOUNTER — Ambulatory Visit (HOSPITAL_COMMUNITY): Payer: 59

## 2018-07-14 ENCOUNTER — Encounter (HOSPITAL_COMMUNITY): Payer: 59

## 2018-07-17 ENCOUNTER — Encounter (HOSPITAL_COMMUNITY): Payer: Self-pay

## 2018-07-17 ENCOUNTER — Ambulatory Visit (HOSPITAL_COMMUNITY): Payer: 59

## 2018-07-17 DIAGNOSIS — G8929 Other chronic pain: Secondary | ICD-10-CM

## 2018-07-17 DIAGNOSIS — R29898 Other symptoms and signs involving the musculoskeletal system: Secondary | ICD-10-CM

## 2018-07-17 DIAGNOSIS — M25611 Stiffness of right shoulder, not elsewhere classified: Secondary | ICD-10-CM

## 2018-07-17 DIAGNOSIS — M25511 Pain in right shoulder: Principal | ICD-10-CM

## 2018-07-17 NOTE — Therapy (Signed)
[place], [place], [address] Phone: [phone]   Fax:  [phone]  Occupational Therapy Treatment Reassessment/discharge Patient Details  Name: [name] MRN: [id number] Date of Birth: 1965-10-14 Referring Provider (OT): Victorino December, MD   Encounter Date: 07/17/2018  OT End of Session - 07/17/18 1458    Visit Number  9    Number of Visits  12    Authorization Type  AETNA     Authorization Time Period  100 visits per year PT/OT combined 0 used    Authorization - Visit Number  4    Authorization - Number of Visits  100    OT Start Time  0350   reassess and discharge    OT Stop Time  1500    OT Time Calculation (min)  27 min    Activity Tolerance  Patient tolerated treatment well    Behavior During Therapy  [facility] for tasks assessed/performed       Past Medical History:  Diagnosis Date  . Asthma   . Endometriosis   . Eosinophilic esophagitis   . Feline esophagus   . GERD (gastroesophageal reflux disease)   . HSV-2 (herpes simplex virus 2) infection   . Hyperlipidemia   . Psoriasis   . Ulcerative colitis [facility])     Past Surgical History:  Procedure Laterality Date  . ABDOMINAL HYSTERECTOMY  11/2010  . COLONOSCOPY  04/13/2012   Procedure: COLONOSCOPY;  Surgeon: Lafayette Dragon, MD;  [facility];  Service: Endoscopy;  Laterality: N/A;  . ESOPHAGOGASTRODUODENOSCOPY  04/13/2012   Procedure: ESOPHAGOGASTRODUODENOSCOPY (EGD);  Surgeon: Lafayette Dragon, MD;  Location: [facility];  Service: Endoscopy;  Laterality: N/A;  . LAPAROSCOPY  1990   For endometriosis   . TUBAL LIGATION  1999    There were no vitals filed for this visit.  Subjective Assessment - 07/17/18 1459    Subjective   S: I'm feeling really good. i only notice some difficulty when lifting overhead.     Currently in Pain?  No/denies         [facility] OT Assessment - 07/17/18 1435      Assessment   Medical Diagnosis  right  shoulder arthroscopy    Referring Provider (OT)  Victorino December, MD      Precautions   Precautions  Shoulder    Type of Shoulder Precautions  Basic protocol: Complete P/ROM/AA/ROM (week 1-3) Once patient has full P/ROM progress to phase II (week 4-5 (07/03/18-07/10/18) Progress to A/ROM, avoid repetitive overhead activities. Week 7-8 ( 07/24/18-07/31/18) begin strengthening.      ROM / Strength   AROM / PROM / Strength  PROM;Strength;AROM      Palpation   Palpation comment  Trace fascial restrictions in left upper arm, trapezious, and scapularis region.       AROM   Overall AROM Comments  Assessed standing with IR/er abducted.    AROM Assessment Site  Shoulder    Right/Left Shoulder  Right    Right Shoulder Flexion  160 Degrees   previous: 151   Right Shoulder ABduction  160 Degrees   previous: 150   Right Shoulder Internal Rotation  90 Degrees   previous: 90   Right Shoulder External Rotation  90 Degrees   previous: 80     PROM   Overall PROM   Within functional limits for tasks performed    PROM Assessment Site  Shoulder    Right/Left Shoulder  Right  Strength   Overall Strength Comments  Assessed standing. IR/er abducted.     Strength Assessment Site  Shoulder    Right/Left Shoulder  Right    Right Shoulder Flexion  4+/5   previous; 4+/5   Right Shoulder ABduction  4+/5   previous: 4+/5   Right Shoulder Internal Rotation  4+/5   previous: 4-/5   Right Shoulder External Rotation  4+/5   previous; 4-/5              OT Treatments/Exercises (OP) - 07/17/18 1501      Exercises   Exercises  Shoulder      Shoulder Exercises: Supine   Protraction  PROM;5 reps    Horizontal ABduction  PROM;5 reps    External Rotation  PROM;5 reps    Internal Rotation  PROM;5 reps    Flexion  PROM;5 reps    ABduction  PROM;5 reps      Shoulder Exercises: Standing   Other Standing Exercises  theraband wall walk with green theraband 10 times     Other Standing Exercises  green  band loop; scapular clocks; 10X each direction      Manual Therapy   Manual Therapy  Myofascial release    Manual therapy comments  Manual therapy completed separately from exercises    Soft tissue mobilization  Myofascial release and manual stretching completed to right upper arm, trapezius, and scapularis region to increase joint mobility and decrease fascial restrictions.              OT Education - 07/17/18 1453    Education Details  patient is to stop previous HEP. New upgraded HEP provided: green band loop: wall walk and scapular clocks.     Person(s) Educated  Patient    Methods  Explanation;Handout;Demonstration;Verbal cues    Comprehension  Verbalized understanding;Returned demonstration       OT Short Term Goals - 07/07/18 1017      OT SHORT TERM GOAL #1   Title  Patient will be educated and independent with HEP to faciliate her progress in therapy and allow her to return to using her RUE for daily and work related tasks.    Time  3    Period  Weeks      OT SHORT TERM GOAL #2   Title  Pt will increase P/ROM of RUE to [facility] to increase ability to complete upper body dressing tasks with less difficulty.     Time  3    Period  Weeks      OT SHORT TERM GOAL #3   Title  Patient will increase RUE strength to 3/5 in order to complete simple daily tasks as waist level.    Time  3    Period  Weeks      OT SHORT TERM GOAL #4   Title  Patient will report a decrease in pain of approximately 5/10 when completing daily tasks with the RUE.     Time  3    Period  Weeks      OT SHORT TERM GOAL #5   Title  Pt will decrease RUE fascial restrictions to moderate amount in order to increase functional mobility needed to complete low level reaching tasks.     Time  3    Period  Weeks        OT Long Term Goals - 07/17/18 1446      OT LONG TERM GOAL #1   Title  Patient will return to highest  level of independence while usinh her right UE for all daily and work related tasks.      Time  6    Period  Weeks      OT LONG TERM GOAL #2   Title  Pt will increase her RUE A/ROM to WNL to increase ability to complete overhead lifting tasks with less difficulty.     Time  6    Period  Weeks      OT LONG TERM GOAL #3   Title  Pt will increase her RUE strength to 4+/5 in order to return to completing normal household lifting tasks and household chores with increased comfort and shoulder stability.     Time  6    Period  Weeks    Status  Achieved      OT LONG TERM GOAL #4   Title  Pt will decrease fascial restrictions to minimum amount in order to increase functional mobility needed to complete overhead reaching tasks with less difficulty.     Time  6    Period  Weeks      OT LONG TERM GOAL #5   Title  Pt will report a decrease in pain level of approximately 2/10 or less in order to complete all daily and work related tasks while using her RUE as her dominant extremity.     Time  6    Period  Weeks            Plan - 07/17/18 1502    Clinical Impression Statement  A: Reassessment completed this date. patient has met all therapy goals. She reports not difficulty with completing basic daily tasks. Pt reports only noticing some difficulty with overhead lifting. She was able to complete heavy household cleaning over the weekend. Strength is 4+/5 overall. She has functional ROM. HEP was updated and patient is in agreement with discharge.     Plan  P: D/C from OT services with HEP. patient is to follow up with MD tomorrow.     Consulted and Agree with Plan of Care  Patient       Patient will benefit from skilled therapeutic intervention in order to improve the following deficits and impairments:  Decreased range of motion, Pain, Impaired UE functional use, Increased fascial restrictions, Decreased strength  Visit Diagnosis: Chronic right shoulder pain  Stiffness of right shoulder, not elsewhere classified  Other symptoms and signs involving the musculoskeletal  system    Problem List Patient Active Problem List   Diagnosis Date Noted  . 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 Reflux esophagitis 04/13/2012  . Esophageal reflux 04/13/2012   OCCUPATIONAL THERAPY DISCHARGE SUMMARY  Visits from Start of Care: 9  Current functional level related to goals / functional outcomes: See above   Remaining deficits: See above   Education / Equipment: See above Plan: Patient agrees to discharge.  Patient goals were met. Patient is being discharged due to meeting the stated rehab goals.  ?????        Ailene Ravel, OTR/L,CBIS  [phone] 07/17/2018, 3:04 PM  [place] [address] [place], [place], [address] Phone: [phone]   Fax:  [phone]  Name: [name] MRN: [id number] Date of Birth: Oct 25, 1965

## 2018-07-17 NOTE — Patient Instructions (Signed)
Wall walks - upward  Walk your hand up the wall in a diagonal direction, going against the resistance of the band.  Complete 10 times going up. 1 set    scapular clocks    start with arm straight and placed at 9 and 3 positions  on the wall . Then with one arm pull band into different clock positions  with either arm.  10-12 times to 2:00, 10-12 times to 3:00, 10-12 times to 4:00.

## 2018-12-29 ENCOUNTER — Other Ambulatory Visit: Payer: Self-pay

## 2018-12-29 ENCOUNTER — Telehealth: Payer: 59 | Admitting: Family

## 2018-12-29 ENCOUNTER — Ambulatory Visit
Admission: EM | Admit: 2018-12-29 | Discharge: 2018-12-29 | Disposition: A | Discharge status: Home / Self Care | Payer: 59 | Attending: Emergency Medicine | Admitting: Emergency Medicine

## 2018-12-29 DIAGNOSIS — R300 Dysuria: Secondary | ICD-10-CM

## 2018-12-29 DIAGNOSIS — N3001 Acute cystitis with hematuria: Secondary | ICD-10-CM | POA: Insufficient documentation

## 2018-12-29 DIAGNOSIS — R109 Unspecified abdominal pain: Secondary | ICD-10-CM

## 2018-12-29 LAB — POCT URINALYSIS DIP (MANUAL ENTRY)
Bilirubin, UA: NEGATIVE
Glucose, UA: NEGATIVE mg/dL
Ketones, POC UA: NEGATIVE mg/dL
Nitrite, UA: NEGATIVE
Protein Ur, POC: NEGATIVE mg/dL
Spec Grav, UA: <=1.005 — AB (ref 1.010–1.025)
Urobilinogen, UA: 0.2 E.U./dL
pH, UA: 6 (ref 5.0–8.0)

## 2018-12-29 MED ORDER — NITROFURANTOIN MONOHYD MACRO 100 MG PO CAPS: 100.0000 mg | ORAL_CAPSULE | Freq: Two times a day (BID) | ORAL | 0 refills | Status: AC

## 2018-12-29 NOTE — Discharge Instructions (Signed)
Urine showed signs of UTI Urine culture sent.  We will call you with abnormal results.   Push fluids and get plenty of rest.   Take antibiotic as directed and to completion Follow up with PCP if symptoms persists Return here or go to ER if you have any new or worsening symptoms such as fever, worsening abdominal pain, nausea/vomiting, flank pain, etc..Marland Kitchen

## 2018-12-29 NOTE — ED Triage Notes (Signed)
Pt presents with complaints of lower abdominal pain, cloudy urine and odor to urine over the last few days. Concerned for UTI.

## 2018-12-29 NOTE — ED Provider Notes (Signed)
[facility]   CC: UTI  SUBJECTIVE:  [name] is a 53 y.o. female hx significant for asthma, endometriosis, eosinophilic esophagitis, feline esophagus, GERD, HSV2, HLD, psoriasis, and UC, who complains of intermittent lower abdominal pressure with urination, cloudy urine and urine odor for the past few days.  [place]  States she did not have access to a bathroom, and was not drinking as much water as she should have.  Has NOT tried OTC medications.  Symptoms are made worse with urination.  Admits to similar symptoms in the past.  Denies fever, chills, nausea, vomiting, flank pain, abnormal vaginal discharge or bleeding, hematuria.    LMP: No LMP recorded. Patient has had a hysterectomy.  ROS: As in HPI.  Past Medical History:  Diagnosis Date  . Asthma   . Endometriosis   . Eosinophilic esophagitis   . Feline esophagus   . GERD (gastroesophageal reflux disease)   . HSV-2 (herpes simplex virus 2) infection   . Hyperlipidemia   . Psoriasis   . Ulcerative colitis [facility])    Past Surgical History:  Procedure Laterality Date  . ABDOMINAL HYSTERECTOMY  11/2010  . CERVICAL DISC SURGERY    . COLONOSCOPY  04/13/2012   Procedure: COLONOSCOPY;  Surgeon: Lafayette Dragon, MD;  Location: [facility];  Service: Endoscopy;  Laterality: N/A;  . ESOPHAGOGASTRODUODENOSCOPY  04/13/2012   Procedure: ESOPHAGOGASTRODUODENOSCOPY (EGD);  Surgeon: Lafayette Dragon, MD;  Location: [facility];  Service: Endoscopy;  Laterality: N/A;  . LAPAROSCOPY  1990   For endometriosis   . SHOULDER ARTHROSCOPY    . TUBAL LIGATION  1999   Allergies  Allergen Reactions  . Sulfa Antibiotics Anaphylaxis   No current facility-administered medications on file prior to encounter.    Current Outpatient Medications on File Prior to Encounter  Medication Sig Dispense Refill  . fish oil-omega-3 fatty acids 1000 MG capsule Take 1 capsule by mouth at bedtime.    . naproxen  (NAPROSYN) 250 MG tablet Take by mouth 2 (two) times daily with a meal.    . oxycodone-acetaminophen (LYNOX) 5-300 MG tablet Take 1 tablet by mouth every 4 (four) hours as needed for pain.    Marland Kitchen PREDNISONE, PAK, PO Take by mouth as needed.    Marland Kitchen PROAIR HFA 108 (90 Base) MCG/ACT inhaler 1-2 INHALATIONS EVERY 4-6 HOURS AS NEEDED FOR WHEEZING. DISPENSE SPACER AS NEEDED.  0   Social History   Socioeconomic History  . Marital status: Divorced    Spouse name: Not on file  . Number of children: 2  . Years of education: Not on file  . Highest education level: Not on file  Occupational History  . Occupation: Estate manager/land agent: [employer]  Social Needs  . Financial resource strain: Not on file  . Food insecurity    Worry: Not on file    Inability: Not on file  . Transportation needs    Medical: Not on file    Non-medical: Not on file  Tobacco Use  . Smoking status: Current Every Day Smoker    Packs/day: 0.50    Types: Cigarettes  . Smokeless tobacco: Never Used  Substance and Sexual Activity  . Alcohol use: No  . Drug use: No  . Sexual activity: Not on file  Lifestyle  . Physical activity    Days per week: Not on file    Minutes per session: Not on file  . Stress: Not on  file  Relationships  . Social Musicianconnections    Talks on phone: Not on file    Gets together: Not on file    Attends religious service: Not on file    Active member of club or organization: Not on file    Attends meetings of clubs or organizations: Not on file    Relationship status: Not on file  . Intimate partner violence    Fear of current or ex partner: Not on file    Emotionally abused: Not on file    Physically abused: Not on file    Forced sexual activity: Not on file  Other Topics Concern  . Not on file  Social History Narrative   Daily caffeine    Family History  Problem Relation Age of Onset  . Ulcerative colitis Maternal Grandfather   . Heart disease Maternal Grandfather   .  Esophageal cancer Paternal Grandfather   . Heart disease Maternal Grandmother   . Healthy Mother   . Healthy Father   . Colon cancer Neg Hx     OBJECTIVE:  Vitals:   12/29/18 1233  BP: (!) 159/94  Pulse: 65  Resp: 19  Temp: 98 F (36.7 C)  SpO2: 96%   General appearance: Alert in no acute distress HEENT: NCAT.  PERRL, EOMI grossly. Oropharynx clear.  Lungs: clear to auscultation bilaterally without adventitious breath sounds Heart: regular rate and rhythm.   Abdomen: soft; non-distended; no tenderness; bowel sounds present; no guarding Back: no CVA tenderness Extremities: no edema; symmetrical with no gross deformities Skin: warm and dry Neurologic: Ambulates from chair to exam table without difficulty Psychological: alert and cooperative; normal mood and affect  Labs Reviewed  POCT URINALYSIS DIP (MANUAL ENTRY) - Abnormal; Notable for the following components:      Result Value   Spec Grav, UA <=1.005 (*)    Blood, UA small (*)    Leukocytes, UA Small (1+) (*)    All other components within normal limits    ASSESSMENT & PLAN:  1. Acute cystitis with hematuria     Meds ordered this encounter  Medications  . nitrofurantoin, macrocrystal-monohydrate, (MACROBID) 100 MG capsule    Sig: Take 1 capsule (100 mg total) by mouth 2 (two) times daily for 5 days.    Dispense:  10 capsule    Refill:  0    Order Specific Question:   Supervising Provider    Answer:   Eustace MooreELSON, YVONNE SUE [[id number]]   Urine showed signs of UTI Urine culture sent.  We will call you with abnormal results.   Push fluids and get plenty of rest.   Macrobid prescribed.   Take antibiotic as directed and to completion Follow up with PCP if symptoms persists Return here or go to ER if you have any new or worsening symptoms such as fever, worsening abdominal pain, nausea/vomiting, flank pain, etc...  Outlined signs and symptoms indicating need for more acute intervention. Patient verbalized  understanding. After Visit Summary given.     Rennis HardingWurst, Teondre Jarosz, PA-C 12/29/18 1255

## 2018-12-29 NOTE — Progress Notes (Signed)
Thank you for the details you included in the comment boxes. Those details are very helpful in determining the best course of treatment for you and help us to provide the best care.  Based on what you shared with me, I feel your condition warrants further evaluation and I recommend that you be seen for a face to face office visit.  NOTE: If you entered your credit card information for this eVisit, you will not be charged. You may see a "hold" on your card for the $35 but that hold will drop off and you will not have a charge processed.  If you are having a true medical emergency please call 911.     For an urgent face to face visit, [place] has five urgent care centers for your convenience:    [email or web address] to reserve your spot online an avoid wait times  [facility] [address], [address] [place], [place] [address] Modified hours of operation: Monday-Friday, 12 PM to 6 PM  Closed Saturday & Sunday  *Across the street from [employer]  [facility] (New Address!) [address], [address] [place], [place] [address] *Just off [address], across the road from [employer]* Modified hours of operation: Monday-Friday, 12 PM to 6 PM  Closed Saturday & Sunday   The following sites will take your insurance:  . [facility]    [phone]                  Get Driving Directions  [address] [place], [place] [address] . 10 am to 8 pm Monday-Friday . 12 pm to 8 pm Saturday-Sunday   . [facility]  [phone]                  Get Driving Directions  [address], [address] [place], [place] [address] . 8 am to 8 pm Monday-Friday . 9 am to 6 pm Saturday . 11 am to 6 pm Sunday   . [facility] [place]  [phone]                  Get Driving Directions   [address] [place], [place] [address] . 8 am to 8 pm Monday-Friday .  8 am to 4 pm Saturday-Sunday    . [facility]                    Get Driving Directions  [phone]  [address] [place], [place] [address]  . Monday-Friday, 12 PM to 6 PM    Your e-visit answers were reviewed by a board certified advanced clinical practitioner to complete your personal care plan.  Thank you for using e-Visits.  

## 2018-12-31 LAB — URINE CULTURE: Culture: >=100000 — AB

## 2019-01-11 ENCOUNTER — Telehealth: Payer: Self-pay | Admitting: *Deleted

## 2019-01-11 ENCOUNTER — Telehealth: Payer: Self-pay | Admitting: Family Medicine

## 2019-01-11 ENCOUNTER — Ambulatory Visit (INDEPENDENT_AMBULATORY_CARE_PROVIDER_SITE_OTHER)
Admission: RE | Admit: 2019-01-11 | Discharge: 2019-01-11 | Disposition: A | Discharge status: Home / Self Care | Payer: 59 | Source: Ambulatory Visit

## 2019-01-11 DIAGNOSIS — R140 Abdominal distension (gaseous): Secondary | ICD-10-CM

## 2019-01-11 DIAGNOSIS — R300 Dysuria: Secondary | ICD-10-CM | POA: Diagnosis not present

## 2019-01-11 MED ORDER — CEPHALEXIN 500 MG PO CAPS: 500.0000 mg | ORAL_CAPSULE | Freq: Two times a day (BID) | ORAL | 0 refills | Status: AC

## 2019-01-11 NOTE — Telephone Encounter (Signed)
Needs visit

## 2019-01-11 NOTE — ED Provider Notes (Signed)
[place]   Virtual Visit via Video Note:  [name]  initiated request for Telemedicine visit with [facility]gent [facility] team. I connected with [name]  on 01/11/2019 at 1:05 PM  for a synchronized telemedicine visit using a video enabled HIPPA compliant telemedicine application. I verified that I am speaking with [name]  using two identifiers. Kim Box, PA-C  was physically located in a [facility] Urgent care site and [name] was located at a different location.   The limitations of evaluation and management by telemedicine as well as the availability of in-person appointments were discussed. Patient was informed that she  may incur a bill ( including co-pay) for this virtual visit encounter. [name]  expressed understanding and gave verbal consent to proceed with virtual visit.   [id number] 01/11/19 Arrival Time: 1257  CC: Burning with urination  SUBJECTIVE:  [name] is a 53 y.o. female who complains of dysuria and abdominal bloating x 1 day.  Patient denies a precipitating event, recent sexual encounter, excessive caffeine intake.  Was treated recently for UTI and did not finish course of antibiotic.  Was prescribed macrobid.  Urine culture sensitive to macrobid.  Has NOT tried OTC AZO due to sulfa allergy.  Symptoms are made worse with urination.  Admits to similar symptoms in the past.  Complains of mild associated nausea, urine odor, and cloudy urine.  Denies fever, chills, vomiting, abdominal pain, flank pain, increased urinary frequency or urgency, abnormal vaginal discharge, odor, itching, burning.    LMP: No LMP recorded. Patient has had a hysterectomy.  ROS: As in HPI.  All other pertinent ROS negative.     Past Medical History:  Diagnosis Date  . Asthma   . Endometriosis   . Eosinophilic esophagitis   . Feline esophagus   . GERD (gastroesophageal reflux disease)   . HSV-2 (herpes simplex virus 2) infection    . Hyperlipidemia   . Psoriasis   . Ulcerative colitis [facility])    Past Surgical History:  Procedure Laterality Date  . ABDOMINAL HYSTERECTOMY  11/2010  . CERVICAL DISC SURGERY    . COLONOSCOPY  04/13/2012   Procedure: COLONOSCOPY;  Surgeon: Lafayette Dragon, MD;  Location: [facility];  Service: Endoscopy;  Laterality: N/A;  . ESOPHAGOGASTRODUODENOSCOPY  04/13/2012   Procedure: ESOPHAGOGASTRODUODENOSCOPY (EGD);  Surgeon: Lafayette Dragon, MD;  Location: [facility];  Service: Endoscopy;  Laterality: N/A;  . LAPAROSCOPY  1990   For endometriosis   . SHOULDER ARTHROSCOPY    . TUBAL LIGATION  1999   Allergies  Allergen Reactions  . Sulfa Antibiotics Anaphylaxis   No current facility-administered medications on file prior to encounter.    Current Outpatient Medications on File Prior to Encounter  Medication Sig Dispense Refill  . fish oil-omega-3 fatty acids 1000 MG capsule Take 1 capsule by mouth at bedtime.    . naproxen (NAPROSYN) 250 MG tablet Take by mouth 2 (two) times daily with a meal.    . oxycodone-acetaminophen (LYNOX) 5-300 MG tablet Take 1 tablet by mouth every 4 (four) hours as needed for pain.    Marland Kitchen PREDNISONE, PAK, PO Take by mouth as needed.    Marland Kitchen PROAIR HFA 108 (90 Base) MCG/ACT inhaler 1-2 INHALATIONS EVERY 4-6 HOURS AS NEEDED FOR WHEEZING. DISPENSE SPACER AS NEEDED.  0   OBJECTIVE:  There were no vitals filed for this visit.  General appearance: alert; no distress Eyes: EOMI grossly HENT: normocephalic;  atraumatic Neck: supple with FROM Lungs: normal respiratory effort; speaking in full sentences without difficulty Extremities: moves extremities without difficulty Skin: No obvious rashes Neurologic: No facial asymmetries Psychological: alert and cooperative; normal mood and affect   ASSESSMENT & PLAN:  1. Dysuria   2. Bloating     Meds ordered this encounter  Medications  . cephALEXin (KEFLEX) 500 MG capsule    Sig: Take 1 capsule (500 mg total) by mouth 2  (two) times daily for 7 days.    Dispense:  14 capsule    Refill:  0    Order Specific Question:   Supervising Provider    Answer:   Eustace MooreELSON, YVONNE SUE [[id number]]    Symptoms sound consistent with UTI Push fluids and get plenty of rest.   Take antibiotic as directed and to completion Follow up in person or with PCP if symptoms persists Follow up in person or go to ER if you have any new or worsening symptoms such as fever, worsening abdominal pain, nausea/vomiting, flank pain, etc...  I discussed the assessment and treatment plan with the patient. The patient was provided an opportunity to ask questions and all were answered. The patient agreed with the plan and demonstrated an understanding of the instructions.   The patient was advised to call back or seek an in-person evaluation if the symptoms worsen or if the condition fails to improve as anticipated.  I provided 10 minutes of non-face-to-face time during this encounter.  MartinBrittany Yotam Rhine, PA-C  01/11/2019 1:05 PM      Rennis HardingWurst, Lewin Pellow, PA-C 01/11/19 1309

## 2019-01-11 NOTE — Telephone Encounter (Signed)
Patient is experiencing the same symptoms she had two weeks ago that she went to an Urgent Care.  She was diagnosed as having an UTI.  She was prescribed medication but she is still experiencing pain, and now she is experiencing burning with it.  Patient is thinking she just needs another round of the medication and have it sent to her preferred pharmacy [facility] in [place].

## 2019-01-11 NOTE — Telephone Encounter (Signed)
Pt states she didn't want an appt just wanted more antibiotics. I explained that we would have to be seen to get meds. She stated she was going to call back to Urgent care and see if they can refill if not she will callback

## 2019-01-11 NOTE — Discharge Instructions (Signed)
°  Symptoms sound consistent with UTI Push fluids and get plenty of rest.   Take antibiotic as directed and to completion Follow up in person or with PCP if symptoms persists Follow up in person or go to ER if you have any new or worsening symptoms such as fever, worsening abdominal pain, nausea/vomiting, flank pain, etc..Marland Kitchen

## 2019-01-11 NOTE — Telephone Encounter (Signed)
Pt was seen 12/29/18 for UTI.  States she completed course of abx with resolution of sxs, except "I missed the last day of abx"; states started last night with slight nausea, dysuria, and urinary urgency without fevers or vomiting. Discussed with B. Wurst, PA: per PA instructions, pt needs to either come in or have a video visit since she had improved and completed the course of abx.  Pt notified and verbalized understanding.

## 2019-04-18 ENCOUNTER — Ambulatory Visit (INDEPENDENT_AMBULATORY_CARE_PROVIDER_SITE_OTHER)
Admission: RE | Admit: 2019-04-18 | Discharge: 2019-04-18 | Disposition: A | Discharge status: Home / Self Care | Payer: 59 | Source: Ambulatory Visit

## 2019-04-18 DIAGNOSIS — Z20828 Contact with and (suspected) exposure to other viral communicable diseases: Secondary | ICD-10-CM | POA: Diagnosis not present

## 2019-04-18 DIAGNOSIS — J209 Acute bronchitis, unspecified: Secondary | ICD-10-CM | POA: Diagnosis not present

## 2019-04-18 DIAGNOSIS — Z20822 Contact with and (suspected) exposure to covid-19: Secondary | ICD-10-CM

## 2019-04-18 DIAGNOSIS — J3489 Other specified disorders of nose and nasal sinuses: Secondary | ICD-10-CM

## 2019-04-18 MED ORDER — AZITHROMYCIN 250 MG PO TABS: 250.0000 mg | ORAL_TABLET | Freq: Every day | ORAL | 0 refills | Status: DC

## 2019-04-18 MED ORDER — PREDNISONE 20 MG PO TABS: 20.0000 mg | ORAL_TABLET | Freq: Two times a day (BID) | ORAL | 0 refills | Status: AC

## 2019-04-18 NOTE — ED Provider Notes (Signed)
[facility]    Virtual Visit via Video Note:  [name]  initiated request for Telemedicine visit with [facility] Urgent Care team. I connected with [name]  on 04/18/2019 at 4:33 PM  for a synchronized telemedicine visit using a video enabled HIPPA compliant telemedicine application. I verified that I am speaking with [name]  using two identifiers. Kim Harding, PA-C  was physically located in a [facility] Urgent care site and [name] was located at a different location.   The limitations of evaluation and management by telemedicine as well as the availability of in-person appointments were discussed. Patient was informed that she  may incur a bill ( including co-pay) for this virtual visit encounter. [name]  expressed understanding and gave verbal consent to proceed with virtual visit.  [id number] 04/18/19 Arrival Time: 1623  Cc: sinus drainage and cough  SUBJECTIVE:  [name] is a 53 y.o. female who presents with sinus pressure, sinus drainage x 3 days, and non-productive cough x 1-2 days.  Denies sick exposure to COVID, flu or strep.  Denies recent travel.  Does admit to tobacco use, smokes < 1PPD.  Has tried OTC cold and sinus medication without relief.  Denies aggravating factors.  Reports previous symptoms in the past related to bronchitis, that usually improves with azithromycin and prednisone. Complains of associated chest tightness, rhinorrhea, and scratchy sore throat.  Denies fever, chills, fatigue, nasal congestion, SOB, wheezing, chest pain/ pressure, nausea, vomiting, changes in bowel or bladder habits.    ROS: As per HPI.  All other pertinent ROS negative.     Past Medical History:  Diagnosis Date  . Asthma   . Endometriosis   . Eosinophilic esophagitis   . Feline esophagus   . GERD (gastroesophageal reflux disease)   . HSV-2 (herpes simplex virus 2) infection   . Hyperlipidemia   . Psoriasis   . Ulcerative  colitis [facility])    Past Surgical History:  Procedure Laterality Date  . ABDOMINAL HYSTERECTOMY  11/2010  . CERVICAL DISC SURGERY    . COLONOSCOPY  04/13/2012   Procedure: COLONOSCOPY;  Surgeon: Hart Carwin, MD;  Location: [facility];  Service: Endoscopy;  Laterality: N/A;  . ESOPHAGOGASTRODUODENOSCOPY  04/13/2012   Procedure: ESOPHAGOGASTRODUODENOSCOPY (EGD);  Surgeon: Hart Carwin, MD;  Location: [facility];  Service: Endoscopy;  Laterality: N/A;  . LAPAROSCOPY  1990   For endometriosis   . SHOULDER ARTHROSCOPY    . TUBAL LIGATION  1999   Allergies  Allergen Reactions  . Sulfa Antibiotics Anaphylaxis   No current facility-administered medications on file prior to encounter.    Current Outpatient Medications on File Prior to Encounter  Medication Sig Dispense Refill  . fish oil-omega-3 fatty acids 1000 MG capsule Take 1 capsule by mouth at bedtime.    . naproxen (NAPROSYN) 250 MG tablet Take by mouth 2 (two) times daily with a meal.    . oxycodone-acetaminophen (LYNOX) 5-300 MG tablet Take 1 tablet by mouth every 4 (four) hours as needed for pain.    Marland Kitchen PROAIR HFA 108 (90 Base) MCG/ACT inhaler 1-2 INHALATIONS EVERY 4-6 HOURS AS NEEDED FOR WHEEZING. DISPENSE SPACER AS NEEDED.  0      OBJECTIVE:  There were no vitals filed for this visit.   General appearance: alert; no distress Eyes: EOMI grossly HENT: normocephalic; atraumatic; localizes tenderness over maxillary and frontal sinuses Neck: supple with FROM Lungs: normal respiratory effort; speaking in full  sentences without difficulty; mild cough Extremities: moves extremities without difficulty Skin: No obvious rashes Neurologic: No facial asymmetries Psychological: alert and cooperative; normal mood and affect   ASSESSMENT & PLAN:  1. Acute bronchitis, unspecified organism   2. Encounter by telehealth for suspected COVID-19   3. Sinus drainage     Meds ordered this encounter  Medications  . predniSONE  (DELTASONE) 20 MG tablet    Sig: Take 1 tablet (20 mg total) by mouth 2 (two) times daily with a meal for 5 days.    Dispense:  10 tablet    Refill:  0    Order Specific Question:   Supervising Provider    Answer:   [name] [[id number]]  . azithromycin (ZITHROMAX) 250 MG tablet    Sig: Take 1 tablet (250 mg total) by mouth daily. Take first 2 tablets together, then 1 every day until finished.    Dispense:  6 tablet    Refill:  0    Order Specific Question:   Supervising Provider    Answer:   [name] [[id number]]    No orders of the defined types were placed in this encounter.    Declines COVID testing at this time, however, based on symptoms I cannot rule out  In the meantime: You should remain isolated in your home for 10 days from symptom onset AND greater than 72 hours after symptoms resolution (absence of fever without the use of fever-reducing medication and improvement in respiratory symptoms), whichever is longer Get plenty of rest and push fluids Based on symptoms I am also concerned for acute bronchitis Prednisone prescribed.  Take as directed and to completion If cough does not improve or if drainage becomes green/ yellow in color please fill z-pak and take as directed and to completion Use OTC medications like ibuprofen or tylenol as needed fever or pain Follow up with PCP as needed Call, follow up in person, or go to the ED if you have any new or worsening symptoms such as fever, worsening cough, shortness of breath, chest tightness, chest pain, turning blue, changes in mental status, etc...   I discussed the assessment and treatment plan with the patient. The patient was provided an opportunity to ask questions and all were answered. The patient agreed with the plan and demonstrated an understanding of the instructions.   The patient was advised to call back or seek an in-person evaluation if the symptoms worsen or if the condition fails to improve as  anticipated.  I provided 12 minutes of non-face-to-face time during this encounter.  Kim Box, PA-C  04/18/2019 4:33 PM           Kim Box, PA-C 04/18/19 1638

## 2019-04-18 NOTE — Discharge Instructions (Signed)
Declines COVID testing at this time, however, based on symptoms I cannot rule out  In the meantime: You should remain isolated in your home for 10 days from symptom onset AND greater than 72 hours after symptoms resolution (absence of fever without the use of fever-reducing medication and improvement in respiratory symptoms), whichever is longer Get plenty of rest and push fluids Based on symptoms I am also concerned for acute bronchitis Prednisone prescribed.  Take as directed and to completion If cough does not improve or if drainage becomes green/ yellow in color please fill z-pak and take as directed and to completion Use OTC medications like ibuprofen or tylenol as needed fever or pain Follow up with PCP as needed Call, follow up in person, or go to the ED if you have any new or worsening symptoms such as fever, worsening cough, shortness of breath, chest tightness, chest pain, turning blue, changes in mental status, etc..Marland Kitchen

## 2019-04-25 ENCOUNTER — Other Ambulatory Visit: Payer: Self-pay

## 2019-04-25 ENCOUNTER — Ambulatory Visit
Admission: EM | Admit: 2019-04-25 | Discharge: 2019-04-25 | Disposition: A | Discharge status: Home / Self Care | Payer: 59 | Attending: Emergency Medicine | Admitting: Emergency Medicine

## 2019-04-25 DIAGNOSIS — Z20828 Contact with and (suspected) exposure to other viral communicable diseases: Secondary | ICD-10-CM | POA: Diagnosis not present

## 2019-04-25 DIAGNOSIS — R05 Cough: Secondary | ICD-10-CM

## 2019-04-25 DIAGNOSIS — R0981 Nasal congestion: Secondary | ICD-10-CM

## 2019-04-25 DIAGNOSIS — R0789 Other chest pain: Secondary | ICD-10-CM

## 2019-04-25 DIAGNOSIS — R059 Cough, unspecified: Secondary | ICD-10-CM

## 2019-04-25 DIAGNOSIS — Z20822 Contact with and (suspected) exposure to covid-19: Secondary | ICD-10-CM

## 2019-04-25 MED ORDER — DEXAMETHASONE SODIUM PHOSPHATE 10 MG/ML IJ SOLN
10.0000 mg | Freq: Once | INTRAMUSCULAR | Status: AC
  Administered 2019-04-25: 12:00:00 10 mg via INTRAMUSCULAR

## 2019-04-25 MED ORDER — BENZONATATE 100 MG PO CAPS: 100.0000 mg | ORAL_CAPSULE | Freq: Three times a day (TID) | ORAL | 0 refills | Status: DC

## 2019-04-25 MED ORDER — ALBUTEROL SULFATE (2.5 MG/3ML) 0.083% IN NEBU: 2.5000 mg | INHALATION_SOLUTION | Freq: Four times a day (QID) | RESPIRATORY_TRACT | 1 refills | Status: DC | PRN

## 2019-04-25 NOTE — ED Triage Notes (Signed)
Pt presents with c/o cough and chest condition that hasn't improved with antibitotics

## 2019-04-25 NOTE — Discharge Instructions (Signed)
COVID testing ordered.  It will take between 5-7 days for test results.  Someone will contact you regarding abnormal results.    In the meantime: You should remain isolated in your home for 10 days from symptom onset AND greater than 72 hours after symptoms resolution (absence of fever without the use of fever-reducing medication and improvement in respiratory symptoms), whichever is longer Get plenty of rest and push fluids Tessalon Perles prescribed for cough Steroid shot given in office Albuterol solution refilled.  Use as instructed for shortness of breath and/or wheezing Use OTC medications like ibuprofen or tylenol as needed fever or pain Call or go to the ED if you have any new or worsening symptoms such as fever, worsening cough, shortness of breath, chest tightness, chest pain, turning blue, changes in mental status, etc..Marland Kitchen

## 2019-04-25 NOTE — ED Provider Notes (Signed)
[facility] CARE CENTER   [id number] 04/25/19 Arrival Time: 1149  Cc: COUGH  SUBJECTIVE:  [name] is a 53 y.o. female who presents with persistent dry cough, chest tightness, clear nasal congestion, and mild rhinorrhea x 1 week.  Denies sick exposure to COVID, flu or strep.  Denies recent travel.  Was seen by me via video visit on 04/18/2019 for similar symptoms.  Was treated for bronchitis with prednisone, and instructed to fill z-pak if mucus/ sputum became green/ yellow.  States symptoms improved with prednisone, and z-pak, but still complains of chest tightness and persistent cough.  It was also recommended she have COVID testing based on symptoms, but did not go.  Symptoms are made worse in the AM.  Reports previous symptoms in the past that improved with steroid shot and breathing treatments.   Also complains of mild SOB, and wheezing.  Denies fever, chills, fatigue, rhinorrhea, sore throat, chest pain, chest pressure, nausea, vomiting, changes in bowel or bladder habits.    ROS: As per HPI.  All other pertinent ROS negative.     Past Medical History:  Diagnosis Date  . Asthma   . Endometriosis   . Eosinophilic esophagitis   . Feline esophagus   . GERD (gastroesophageal reflux disease)   . HSV-2 (herpes simplex virus 2) infection   . Hyperlipidemia   . Psoriasis   . Ulcerative colitis [facility])    Past Surgical History:  Procedure Laterality Date  . ABDOMINAL HYSTERECTOMY  11/2010  . CERVICAL DISC SURGERY    . COLONOSCOPY  04/13/2012   Procedure: COLONOSCOPY;  Surgeon: Hart Carwin, MD;  Location: [facility] ENDOSCOPY;  Service: Endoscopy;  Laterality: N/A;  . ESOPHAGOGASTRODUODENOSCOPY  04/13/2012   Procedure: ESOPHAGOGASTRODUODENOSCOPY (EGD);  Surgeon: Hart Carwin, MD;  Location: [facility];  Service: Endoscopy;  Laterality: N/A;  . LAPAROSCOPY  1990   For endometriosis   . SHOULDER ARTHROSCOPY    . TUBAL LIGATION  1999   Allergies  Allergen Reactions  . Sulfa  Antibiotics Anaphylaxis   No current facility-administered medications on file prior to encounter.    Current Outpatient Medications on File Prior to Encounter  Medication Sig Dispense Refill  . PROAIR HFA 108 (90 Base) MCG/ACT inhaler 1-2 INHALATIONS EVERY 4-6 HOURS AS NEEDED FOR WHEEZING. DISPENSE SPACER AS NEEDED.  0  . fish oil-omega-3 fatty acids 1000 MG capsule Take 1 capsule by mouth at bedtime.    . naproxen (NAPROSYN) 250 MG tablet Take by mouth 2 (two) times daily with a meal.    . oxycodone-acetaminophen (LYNOX) 5-300 MG tablet Take 1 tablet by mouth every 4 (four) hours as needed for pain.      Social History   Socioeconomic History  . Marital status: Divorced    Spouse name: Not on file  . Number of children: 2  . Years of education: Not on file  . Highest education level: Not on file  Occupational History  . Occupation: Regulatory affairs officer: Network engineer  Social Needs  . Financial resource strain: Not on file  . Food insecurity    Worry: Not on file    Inability: Not on file  . Transportation needs    Medical: Not on file    Non-medical: Not on file  Tobacco Use  . Smoking status: Current Every Day Smoker    Packs/day: 0.50    Types: Cigarettes  . Smokeless tobacco: Never Used  Substance and Sexual Activity  . Alcohol use: No  .  Drug use: No  . Sexual activity: Not on file  Lifestyle  . Physical activity    Days per week: Not on file    Minutes per session: Not on file  . Stress: Not on file  Relationships  . Social Herbalist on phone: Not on file    Gets together: Not on file    Attends religious service: Not on file    Active member of club or organization: Not on file    Attends meetings of clubs or organizations: Not on file    Relationship status: Not on file  . Intimate partner violence    Fear of current or ex partner: Not on file    Emotionally abused: Not on file    Physically abused: Not on file    Forced sexual  activity: Not on file  Other Topics Concern  . Not on file  Social History Narrative   Daily caffeine    Family History  Problem Relation Age of Onset  . Ulcerative colitis Maternal Grandfather   . Heart disease Maternal Grandfather   . Esophageal cancer Paternal Grandfather   . Heart disease Maternal Grandmother   . Healthy Mother   . Healthy Father   . Colon cancer Neg Hx      OBJECTIVE:  Vitals:   04/25/19 1204  BP: (!) 134/93  Pulse: 69  Resp: 20  Temp: 98.1 F (36.7 C)  SpO2: 95%     General appearance: Alert, appears mildly fatigued, but nontoxic; speaking in full sentences without difficulty HEENT:NCAT; Ears: EACs clear, TMs pearly gray; Eyes: PERRL.  EOM grossly intact. Nose: nares patent without rhinorrhea; Throat: tonsils nonerythematous or enlarged, uvula midline  Neck: supple without LAD Lungs: clear to auscultation bilaterally without adventitious breath sounds, possible decreased breath sounds/ tightness over bilateral lung bases; normal respiratory effort; mild cough present Heart: regular rate and rhythm.  Radial pulses 2+ symmetrical bilaterally Skin: warm and dry Psychological: alert and cooperative; normal mood and affect  ASSESSMENT & PLAN:  1. Suspected COVID-19 virus infection   2. Cough   3. Chest tightness     Meds ordered this encounter  Medications  . albuterol (PROVENTIL) (2.5 MG/3ML) 0.083% nebulizer solution    Sig: Take 3 mLs (2.5 mg total) by nebulization every 6 (six) hours as needed for wheezing or shortness of breath.    Dispense:  75 mL    Refill:  1    Order Specific Question:   Supervising Provider    Answer:   Raylene Everts [[id number]]  . dexamethasone (DECADRON) injection 10 mg  . benzonatate (TESSALON) 100 MG capsule    Sig: Take 1 capsule (100 mg total) by mouth every 8 (eight) hours.    Dispense:  21 capsule    Refill:  0    Order Specific Question:   Supervising Provider    Answer:   Raylene Everts [[id number]]     Orders Placed This Encounter  Procedures  . Novel Coronavirus, NAA ([facility])    Standing Status:   Standing    Number of Occurrences:   1    Declines chest x-ray at this time COVID testing ordered.  It will take between 5-7 days for test results.  Someone will contact you regarding abnormal results.    In the meantime: You should remain isolated in your home for 10 days from symptom onset AND greater than 72 hours after symptoms resolution (absence of fever without the use of  fever-reducing medication and improvement in respiratory symptoms), whichever is longer Get plenty of rest and push fluids Tessalon Perles prescribed for cough Steroid shot given in office Albuterol solution refilled.  Use as instructed for shortness of breath and/or wheezing Use OTC medications like ibuprofen or tylenol as needed fever or pain Call or go to the ED if you have any new or worsening symptoms such as fever, worsening cough, shortness of breath, chest tightness, chest pain, turning blue, changes in mental status, etc...   Reviewed expectations re: course of current medical issues. Questions answered. Outlined signs and symptoms indicating need for more acute intervention. Patient verbalized understanding. After Visit Summary given.          Rennis HardingWurst, Rosendo Couser, PA-C 04/25/19 1256

## 2019-04-26 LAB — NOVEL CORONAVIRUS, NAA: SARS-CoV-2, NAA: NOT DETECTED

## 2019-09-28 ENCOUNTER — Other Ambulatory Visit: Payer: Self-pay

## 2019-09-29 NOTE — Patient Instructions (Addendum)
It was good to see you again today, I will be in touch with your labs as soon as possible We will set you up for a mammogram and [place] at the [facility] Will order lung cancer screening CT Pap done today- assuming this is negative you can plan to repeat in about 10 years if at all   We will start you on medication for blood pressure- losartan 50 mg Please keep an eye on your home BP- we would like to see you running between 120- 135/75-90 on average  I would entourage you to have the covid 19 vaccine, and also the Shingrix vaccine to help prevent shingles     Health Maintenance, Female Adopting a healthy lifestyle and getting preventive care are important in promoting health and wellness. Ask your health care provider about:  The right schedule for you to have regular tests and exams.  Things you can do on your own to prevent diseases and keep yourself healthy. What should I know about diet, weight, and exercise? Eat a healthy diet   Eat a diet that includes plenty of vegetables, fruits, low-fat dairy products, and lean protein.  Do not eat a lot of foods that are high in solid fats, added sugars, or sodium. Maintain a healthy weight Body mass index (BMI) is used to identify weight problems. It estimates body fat based on height and weight. Your health care provider can help determine your BMI and help you achieve or maintain a healthy weight. Get regular exercise Get regular exercise. This is one of the most important things you can do for your health. Most adults should:  Exercise for at least 150 minutes each week. The exercise should increase your heart rate and make you sweat (moderate-intensity exercise).  Do strengthening exercises at least twice a week. This is in addition to the moderate-intensity exercise.  Spend less time sitting. Even light physical activity can be beneficial. Watch cholesterol and blood lipids Have your blood tested for lipids and cholesterol at 54  years of age, then have this test every 5 years. Have your cholesterol levels checked more often if:  Your lipid or cholesterol levels are high.  You are older than 54 years of age.  You are at high risk for heart disease. What should I know about cancer screening? Depending on your health history and family history, you may need to have cancer screening at various ages. This may include screening for:  Breast cancer.  Cervical cancer.  Colorectal cancer.  Skin cancer.  Lung cancer. What should I know about heart disease, diabetes, and high blood pressure? Blood pressure and heart disease  High blood pressure causes heart disease and increases the risk of stroke. This is more likely to develop in people who have high blood pressure readings, are of African descent, or are overweight.  Have your blood pressure checked: ? Every 3-5 years if you are 54-43 years of age. ? Every year if you are 54 years old or older. Diabetes Have regular diabetes screenings. This checks your fasting blood sugar level. Have the screening done:  Once every three years after age 54 if you are at a normal weight and have a low risk for diabetes.  More often and at a younger age if you are overweight or have a high risk for diabetes. What should I know about preventing infection? Hepatitis B If you have a higher risk for hepatitis B, you should be screened for this virus. Talk with your  health care provider to find out if you are at risk for hepatitis B infection. Hepatitis C Testing is recommended for:  Everyone born from [birth year] through 1965.  Anyone with known risk factors for hepatitis C. Sexually transmitted infections (STIs)  Get screened for STIs, including gonorrhea and chlamydia, if: ? You are sexually active and are younger than 54 years of age. ? You are older than 54 years of age and your health care provider tells you that you are at risk for this type of infection. ? Your sexual  activity has changed since you were last screened, and you are at increased risk for chlamydia or gonorrhea. Ask your health care provider if you are at risk.  Ask your health care provider about whether you are at high risk for HIV. Your health care provider may recommend a prescription medicine to help prevent HIV infection. If you choose to take medicine to prevent HIV, you should first get tested for HIV. You should then be tested every 3 months for as long as you are taking the medicine. Pregnancy  If you are about to stop having your period (premenopausal) and you may become pregnant, seek counseling before you get pregnant.  Take 400 to 800 micrograms (mcg) of folic acid every day if you become pregnant.  Ask for birth control (contraception) if you want to prevent pregnancy. Osteoporosis and menopause Osteoporosis is a disease in which the bones lose minerals and strength with aging. This can result in bone fractures. If you are 54 years old or older, or if you are at risk for osteoporosis and fractures, ask your health care provider if you should:  Be screened for bone loss.  Take a calcium or vitamin D supplement to lower your risk of fractures.  Be given hormone replacement therapy (HRT) to treat symptoms of menopause. Follow these instructions at home: Lifestyle  Do not use any products that contain nicotine or tobacco, such as cigarettes, e-cigarettes, and chewing tobacco. If you need help quitting, ask your health care provider.  Do not use street drugs.  Do not share needles.  Ask your health care provider for help if you need support or information about quitting drugs. Alcohol use  Do not drink alcohol if: ? Your health care provider tells you not to drink. ? You are pregnant, may be pregnant, or are planning to become pregnant.  If you drink alcohol: ? Limit how much you use to 0-1 drink a day. ? Limit intake if you are breastfeeding.  Be aware of how much  alcohol is in your drink. In the [place] one drink equals one 12 oz bottle of beer (355 mL), one 5 oz glass of wine (148 mL), or one 1 oz glass of hard liquor (44 mL). General instructions  Schedule regular health, dental, and eye exams.  Stay current with your vaccines.  Tell your health care provider if: ? You often feel depressed. ? You have ever been abused or do not feel safe at home. Summary  Adopting a healthy lifestyle and getting preventive care are important in promoting health and wellness.  Follow your health care provider's instructions about healthy diet, exercising, and getting tested or screened for diseases.  Follow your health care provider's instructions on monitoring your cholesterol and blood pressure. This information is not intended to replace advice given to you by your health care provider. Make sure you discuss any questions you have with your health care provider. Document Revised: 05/31/2018 Document Reviewed:  05/31/2018 Elsevier Patient Education  2020 Elsevier Inc.  

## 2019-09-29 NOTE — Progress Notes (Addendum)
[place] at [facility] [address] [place], [place] [address] [phone]  Date:  10/01/2019   Name:  [name]   DOB:  18-Jan-1966   MRN:  [id number]  PCP:  Darreld Mclean, MD    Chief Complaint: Annual Exam (with pap)   History of Present Illness:  [name] is a 54 y.o. very pleasant female patient who presents with the following:  Patient with history of ulcerative colitis, reflux, endometriosis status post hysterectomy.  Here today for routine physical Married to [place] who is also my patient Last seen by myself in March 2019 for preop clearance-she planned to have a C5/6 cervical spine discectomy at that time Pap- hyst 2012, no abnormal pap in the past.  She is not certain if she still has a cervix Mammogram- overdue; she had an abnormal mammogram in 2018, has not yet followed up.  We will order a diagnostic mammogram and ultrasound for her today Labs-due for screening today, she is fasting today Colonoscopy 2013 per Dr. Olevia Perches Covid vaccine- she is not interested in doing this  Shingrix  She is smoking still; she has smoked on and off since her 79s.  On discussion, she does think she has a 30-pack-year history.  She would be interested in doing CT lung cancer screening No sx from her UC- she seems to have been in remission for 10 years or so   BP Readings from Last 3 Encounters:  10/01/19 (!) 144/99  04/25/19 (!) 134/93  12/29/18 (!) 159/94   Her father has HTN   She has gained some weight Admits she is not getting much exercise right now, it is more difficult to control her diet since she is working from home  [employer] from Last 3 Encounters:  10/01/19 211 lb (95.7 kg)  08/29/17 210 lb (95.3 kg)  02/03/17 205 lb 6.4 oz (93.2 kg)     Patient Active Problem List   Diagnosis Date Noted  . 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 Esophageal reflux 04/13/2012    Past  Medical History:  Diagnosis Date  . Asthma   . Endometriosis   . Eosinophilic esophagitis   . Feline esophagus   . GERD (gastroesophageal reflux disease)   . HSV-2 (herpes simplex virus 2) infection   . Hyperlipidemia   . Psoriasis   . Ulcerative colitis [facility])     Past Surgical History:  Procedure Laterality Date  . ABDOMINAL HYSTERECTOMY  11/2010  . CERVICAL DISC SURGERY    . COLONOSCOPY  04/13/2012   Procedure: COLONOSCOPY;  Surgeon: Lafayette Dragon, MD;  Location: [facility] ENDOSCOPY;  Service: Endoscopy;  Laterality: N/A;  . ESOPHAGOGASTRODUODENOSCOPY  04/13/2012   Procedure: ESOPHAGOGASTRODUODENOSCOPY (EGD);  Surgeon: Lafayette Dragon, MD;  Location: [facility];  Service: Endoscopy;  Laterality: N/A;  . LAPAROSCOPY  1990   For endometriosis   . SHOULDER ARTHROSCOPY    . TUBAL LIGATION  1999    Social History   Tobacco Use  . Smoking status: Current Every Day Smoker    Packs/day: 0.50    Types: Cigarettes  . Smokeless tobacco: Never Used  Substance Use Topics  . Alcohol use: No  . Drug use: No    Family History  Problem Relation Age of Onset  . Ulcerative colitis Maternal Grandfather   . Heart disease Maternal Grandfather   . Esophageal cancer Paternal Grandfather   . Heart disease Maternal Grandmother   .  Healthy Mother   . Healthy Father   . Colon cancer Neg Hx     Allergies  Allergen Reactions  . Sulfa Antibiotics Anaphylaxis    Medication list has been reviewed and updated.  No current outpatient medications on file prior to visit.   No current facility-administered medications on file prior to visit.    Review of Systems:  As per HPI- otherwise negative.   Physical Examination: Vitals:   10/01/19 0913  BP: (!) 144/99  Pulse: 65  Resp: 16  Temp: (!) 97.2 F (36.2 C)  SpO2: 95%   Vitals:   10/01/19 0913  Weight: 211 lb (95.7 kg)  Height: 5\' 5"  (1.651 m)   Body mass index is 35.11 kg/m. Ideal Body Weight: Weight in (lb) to have BMI = 25:  149.9  GEN: no acute distress.  Obese, otherwise looks well HEENT: Atraumatic, Normocephalic.  Ears and Nose: No external deformity. CV: RRR, No M/G/R. No JVD. No thrill. No extra heart sounds. PULM: CTA B, no wheezes, crackles, rhonchi. No retractions. No resp. distress. No accessory muscle use. ABD: S, NT, ND, +BS. No rebound. No HSM. EXTR: No c/c/e PSYCH: Normally interactive. Conversant.  Breast exam normal bilaterally Normal pelvic status post hysterectomy.  No masses, skin lesions.  No cervix is present.  Vagina is normal    Assessment and Plan: Physical exam  Encounter for screening mammogram for malignant neoplasm of breast - Plan: CANCELED: MM DIAG BREAST TOMO BILATERAL, CANCELED: BREAST COMPLETE UNI LEFT INC AXILLA  Screening for deficiency anemia - Plan: CBC  Screening for diabetes mellitus - Plan: Comprehensive metabolic panel, Hemoglobin A1c  Screening for hyperlipidemia - Plan: Lipid panel  Screening for thyroid disorder - Plan: TSH  Screening for HIV (human immunodeficiency virus) - Plan: HIV Antibody (routine testing w rflx)  Screening for vaginal cancer - Plan: Cytology - PAP  Encounter for screening for lung cancer - Plan: CT CHEST LUNG CA SCREEN LOW DOSE W/O CM  Essential hypertension - Plan: losartan (COZAAR) 50 MG tablet  Here today for a physical exam and follow-up Labs pending as above Performed on vaginal cuff, I advised patient assuming this is normal she does not need to continue Pap screening Ordered lung cancer screening CT Ordered mammogram Encouraged Shingrix Encouraged tobacco cessation Encouraged exercise, healthy diet, weight loss Start on losartan today for hypertension Moderate medical decision making Will plan further follow- up pending labs.   This visit occurred during the SARS-CoV-2 public health emergency.  Safety protocols were in place, including screening questions prior to the visit, additional usage of staff PPE, and  extensive cleaning of exam room while observing appropriate contact time as indicated for disinfecting solutions.    Signed [place], MD  Received her labs as follows, message to patient Results for orders placed or performed in visit on 10/01/19  CBC  Result Value Ref Range   WBC 6.2 4.0 - 10.5 K/uL   RBC 4.91 3.87 - 5.11 Mil/uL   Platelets 240.0 150.0 - 400.0 K/uL   Hemoglobin 15.1 (H) 12.0 - 15.0 g/dL   HCT 12/01/19 19.4 - 17.4 %   MCV 90.8 78.0 - 100.0 fl   MCHC 33.8 30.0 - 36.0 g/dL   RDW 08.1 44.8 - 18.5 %  Comprehensive metabolic panel  Result Value Ref Range   Sodium 139 135 - 145 mEq/L   Potassium 3.7 3.5 - 5.1 mEq/L   Chloride 103 96 - 112 mEq/L   CO2 28 19 -  32 mEq/L   Glucose, Bld 93 70 - 99 mg/dL   BUN 12 6 - 23 mg/dL   Creatinine, Ser 1.19 0.40 - 1.20 mg/dL   Total Bilirubin 0.6 0.2 - 1.2 mg/dL   Alkaline Phosphatase 78 39 - 117 U/L   AST 13 0 - 37 U/L   ALT 12 0 - 35 U/L   Total Protein 6.3 6.0 - 8.3 g/dL   Albumin 4.3 3.5 - 5.2 g/dL   GFR 41.74 >08.14 mL/min   Calcium 9.2 8.4 - 10.5 mg/dL  Hemoglobin G8J  Result Value Ref Range   Hgb A1c MFr Bld 5.6 4.6 - 6.5 %  Lipid panel  Result Value Ref Range   Cholesterol 246 (H) 0 - 200 mg/dL   Triglycerides 856.3 (H) 0.0 - 149.0 mg/dL   HDL 14.97 >02.63 mg/dL   VLDL 78.5 0.0 - 88.5 mg/dL   LDL Cholesterol 027 (H) 0 - 99 mg/dL   Total CHOL/HDL Ratio 5    NonHDL 193.10   HIV Antibody (routine testing w rflx)  Result Value Ref Range   HIV 1&2 Ab, 4th Generation NON-REACTIVE NON-REACTI  TSH  Result Value Ref Range   TSH 2.68 0.35 - 4.50 uIU/mL  Cytology - PAP  Result Value Ref Range   High risk HPV Negative    Adequacy Satisfactory for evaluation.    Diagnosis (A)     - Atypical squamous cells of undetermined significance (ASC-US)   Comment Normal Reference Range HPV - Negative    addnd 4/15  Received her pap, update message to pt  HPV negative, ascus  Will contact GYN for advice given new  recommendations  Addendum 4/16 Received answer from GYN, repeat Pap in 1 year-message to patient

## 2019-10-01 ENCOUNTER — Other Ambulatory Visit: Payer: Self-pay

## 2019-10-01 ENCOUNTER — Other Ambulatory Visit (HOSPITAL_COMMUNITY)
Admission: RE | Admit: 2019-10-01 | Discharge: 2019-10-01 | Disposition: A | Discharge status: Home / Self Care | Payer: 59 | Source: Ambulatory Visit | Attending: Family Medicine | Admitting: Family Medicine

## 2019-10-01 ENCOUNTER — Encounter: Payer: Self-pay | Admitting: Family Medicine

## 2019-10-01 ENCOUNTER — Ambulatory Visit (INDEPENDENT_AMBULATORY_CARE_PROVIDER_SITE_OTHER): Payer: 59 | Admitting: Family Medicine

## 2019-10-01 VITALS — BP 144/99 | HR 65 | Temp 97.2°F | Resp 16 | Ht 65.0 in | Wt 211.0 lb

## 2019-10-01 DIAGNOSIS — Z122 Encounter for screening for malignant neoplasm of respiratory organs: Secondary | ICD-10-CM

## 2019-10-01 DIAGNOSIS — Z1272 Encounter for screening for malignant neoplasm of vagina: Secondary | ICD-10-CM | POA: Insufficient documentation

## 2019-10-01 DIAGNOSIS — Z1329 Encounter for screening for other suspected endocrine disorder: Secondary | ICD-10-CM | POA: Diagnosis not present

## 2019-10-01 DIAGNOSIS — I10 Essential (primary) hypertension: Secondary | ICD-10-CM

## 2019-10-01 DIAGNOSIS — Z0000 Encounter for general adult medical examination without abnormal findings: Secondary | ICD-10-CM | POA: Diagnosis not present

## 2019-10-01 DIAGNOSIS — Z130 Encounter for screening for diseases of the blood and blood-forming organs and certain disorders involving the immune mechanism: Secondary | ICD-10-CM | POA: Diagnosis not present

## 2019-10-01 DIAGNOSIS — Z114 Encounter for screening for human immunodeficiency virus [HIV]: Secondary | ICD-10-CM

## 2019-10-01 DIAGNOSIS — Z131 Encounter for screening for diabetes mellitus: Secondary | ICD-10-CM

## 2019-10-01 DIAGNOSIS — Z1231 Encounter for screening mammogram for malignant neoplasm of breast: Secondary | ICD-10-CM | POA: Diagnosis not present

## 2019-10-01 DIAGNOSIS — Z13220 Encounter for screening for lipoid disorders: Secondary | ICD-10-CM | POA: Diagnosis not present

## 2019-10-01 LAB — CBC
HCT: 44.6 % (ref 36.0–46.0)
Hemoglobin: 15.1 g/dL — ABNORMAL HIGH (ref 12.0–15.0)
MCHC: 33.8 g/dL (ref 30.0–36.0)
MCV: 90.8 fl (ref 78.0–100.0)
Platelets: 240 10*3/uL (ref 150.0–400.0)
RBC: 4.91 Mil/uL (ref 3.87–5.11)
RDW: 13.8 % (ref 11.5–15.5)
WBC: 6.2 10*3/uL (ref 4.0–10.5)

## 2019-10-01 LAB — COMPREHENSIVE METABOLIC PANEL
ALT: 12 U/L (ref 0–35)
AST: 13 U/L (ref 0–37)
Albumin: 4.3 g/dL (ref 3.5–5.2)
Alkaline Phosphatase: 78 U/L (ref 39–117)
BUN: 12 mg/dL (ref 6–23)
CO2: 28 mEq/L (ref 19–32)
Calcium: 9.2 mg/dL (ref 8.4–10.5)
Chloride: 103 mEq/L (ref 96–112)
Creatinine, Ser: 0.79 mg/dL (ref 0.40–1.20)
GFR: 75.81 mL/min (ref >60.00)
Glucose, Bld: 93 mg/dL (ref 70–99)
Potassium: 3.7 mEq/L (ref 3.5–5.1)
Sodium: 139 mEq/L (ref 135–145)
Total Bilirubin: 0.6 mg/dL (ref 0.2–1.2)
Total Protein: 6.3 g/dL (ref 6.0–8.3)

## 2019-10-01 LAB — LIPID PANEL
Cholesterol: 246 mg/dL — ABNORMAL HIGH (ref 0–200)
HDL: 52.8 mg/dL (ref >39.00)
LDL Cholesterol: 157 mg/dL — ABNORMAL HIGH (ref 0–99)
NonHDL: 193.1
Total CHOL/HDL Ratio: 5
Triglycerides: 183 mg/dL — ABNORMAL HIGH (ref 0.0–149.0)
VLDL: 36.6 mg/dL (ref 0.0–40.0)

## 2019-10-01 LAB — TSH: TSH: 2.68 u[IU]/mL (ref 0.35–4.50)

## 2019-10-01 LAB — HEMOGLOBIN A1C: Hgb A1c MFr Bld: 5.6 % (ref 4.6–6.5)

## 2019-10-01 MED ORDER — LOSARTAN POTASSIUM 50 MG PO TABS: 50.0000 mg | ORAL_TABLET | Freq: Every day | ORAL | 3 refills | Status: DC

## 2019-10-02 ENCOUNTER — Other Ambulatory Visit: Payer: Self-pay | Admitting: Family Medicine

## 2019-10-02 DIAGNOSIS — N6320 Unspecified lump in the left breast, unspecified quadrant: Secondary | ICD-10-CM

## 2019-10-02 DIAGNOSIS — Z1231 Encounter for screening mammogram for malignant neoplasm of breast: Secondary | ICD-10-CM

## 2019-10-02 LAB — HIV ANTIBODY (ROUTINE TESTING W REFLEX): HIV 1&2 Ab, 4th Generation: NONREACTIVE

## 2019-10-03 LAB — CYTOLOGY - PAP
Comment: NEGATIVE
Diagnosis: UNDETERMINED — AB
High risk HPV: NEGATIVE

## 2019-10-04 ENCOUNTER — Encounter: Payer: Self-pay | Admitting: Family Medicine

## 2019-10-05 ENCOUNTER — Encounter: Payer: Self-pay | Admitting: Family Medicine

## 2019-10-09 ENCOUNTER — Encounter: Payer: Self-pay | Admitting: Family Medicine

## 2019-10-09 NOTE — Telephone Encounter (Signed)
-----   Message from Almeta Monas sent at 10/09/2019  8:23 AM EDT ----- Regarding: RE: CT LUNG CANCER SCREENING Good Morning Dr. Patsy Lager, I spoke with Dr. Llana Aliment who is the head of our Lung Cancer Screening CT exams. He said this is correct but unfortunately it has not been approved yet as the new standard recommendations. He stated that there is usually a lag of 1 year so it will probably not be the new recommandations for about another year. He did state that as long as Pre-Authorization is obtained we are allowed/permitted to due these exams on patients not in the now recommendations. So we will be still following the 55-77, current or quit w/in 15 years and 30 pack years hx until it is approved and we receive verification. I am sorry for any confusion and I hope this helps some. Good News is as long as we get Precert for ages above and under we are good to go.  Thanks so much, Erin  ----- Message ----- From: Eugene Garnet Sent: 10/09/2019   8:03 AM EDT To: Almeta Monas Subject: RE: CT LUNG CANCER SCREENING                   FYI ----- Message ----- From: Pearline Cables, MD Sent: 10/09/2019   6:58 AM EDT To: Eugene Garnet Subject: RE: CT LUNG CANCER SCREENING                   Hi [name] Thank you for checking on this!  The recommendations have recently been updated to start screening at age 92- this has caused some confusion  The USPSTF recommends annual screening for lung cancer with low-dose computed tomography (LDCT) in adults aged 11 to 52 years who have a 20 pack-year smoking history and currently smoke or have quit within the past 15 years. Screening should be discontinued once a person has not smoked for 15 years or develops a health problem that substantially limits life expectancy or the ability or willingness to have curative lung surgery.  So I think [name] should be covered now- again this is a recent change  Thank you! JC ----- Message ----- From: Eugene Garnet Sent: 10/08/2019   9:56 AM EDT To: Pearline Cables, MD, Maia Petties Subject: CT LUNG CANCER SCREENING                       Good Morning:  [name] is not quite at the age guidelines for Lung Cancer screening.  Age 26 to 81.  She is 15 and will not be 55 till March of 2022.  Thanks, [name]

## 2019-10-19 ENCOUNTER — Telehealth: Payer: Self-pay | Admitting: Family Medicine

## 2019-10-19 NOTE — Telephone Encounter (Signed)
Caller: [name]  Call Back # [phone]  Subject: CT   Patient states she  tried to schedule appointment for CT Lung Screen. Pt unable to schedule appointment, per patient scheduler stated that she was too young.  Patient spoke with someone at [facility]  Please Advise

## 2019-10-22 NOTE — Telephone Encounter (Signed)
No answer on phone, sent patient mychart message.

## 2019-10-26 ENCOUNTER — Encounter: Payer: Self-pay | Admitting: Family Medicine

## 2019-10-29 ENCOUNTER — Ambulatory Visit
Admission: RE | Admit: 2019-10-29 | Discharge: 2019-10-29 | Disposition: A | Discharge status: Home / Self Care | Payer: 59 | Source: Ambulatory Visit | Attending: Family Medicine | Admitting: Family Medicine

## 2019-10-29 ENCOUNTER — Other Ambulatory Visit: Payer: Self-pay

## 2019-10-29 DIAGNOSIS — N6320 Unspecified lump in the left breast, unspecified quadrant: Secondary | ICD-10-CM

## 2019-12-04 ENCOUNTER — Telehealth: Payer: Self-pay | Admitting: Family Medicine

## 2019-12-04 DIAGNOSIS — I10 Essential (primary) hypertension: Secondary | ICD-10-CM

## 2019-12-04 MED ORDER — LOSARTAN POTASSIUM 50 MG PO TABS: 75.0000 mg | ORAL_TABLET | Freq: Every day | ORAL | 3 refills | Status: DC

## 2019-12-04 NOTE — Telephone Encounter (Signed)
losartan (COZAAR) 50 MG tablet [[id number]]   Patient states that Dr Patsy Lager increased her medication , patient is now taking 1 and 1/2 tablets daily to control blood pressure .   Patient needs a new prescription sent in to pharmacy   [id number] - [place], [place] - [address]  [address], [place] [address]  Phone:  [phone] Fax:  [phone]  DEA #:  [id number]

## 2019-12-04 NOTE — Telephone Encounter (Signed)
Patient states that Dr Patsy Lager increased her blood pressure prescription. Patient states pharmacy needs a new prescription. Patient states

## 2019-12-04 NOTE — Telephone Encounter (Signed)
Refilled medication upon looking at Dr. Cyndie Chime last note-updated to reflect in patients chart.

## 2019-12-26 ENCOUNTER — Encounter: Payer: Self-pay | Admitting: Family Medicine

## 2019-12-26 DIAGNOSIS — I10 Essential (primary) hypertension: Secondary | ICD-10-CM

## 2019-12-26 MED ORDER — LOSARTAN POTASSIUM 50 MG PO TABS: 75.0000 mg | ORAL_TABLET | Freq: Every day | ORAL | 3 refills | Status: DC

## 2020-01-16 ENCOUNTER — Encounter: Payer: Self-pay | Admitting: Family Medicine

## 2020-04-29 ENCOUNTER — Telehealth: Payer: 59 | Admitting: Physician Assistant

## 2020-04-29 DIAGNOSIS — J019 Acute sinusitis, unspecified: Secondary | ICD-10-CM

## 2020-04-29 MED ORDER — DOXYCYCLINE HYCLATE 100 MG PO CAPS: 100.0000 mg | ORAL_CAPSULE | Freq: Two times a day (BID) | ORAL | 0 refills | Status: DC

## 2020-04-29 NOTE — Progress Notes (Signed)

## 2020-07-14 IMAGING — MG DIGITAL DIAGNOSTIC BILAT W/ TOMO W/ CAD
6 of 10 series · 6 of 30 positions shown · non-contrast
Comparison: Previous exam(s).

CLINICAL DATA: 54-year-old patient presents for annual mammogram
and delayed follow-up of probably benign masses in the left breast.
She has a biopsy-proven fibroadenoma in the [DATE] position of the
left breast, biopsy performed in 7659.Patient is asymptomatic.

EXAM:
DIGITAL DIAGNOSTIC BILATERAL MAMMOGRAM WITH CAD AND TOMO
ULTRASOUND LEFT BREAST

[R CC synth-2D]
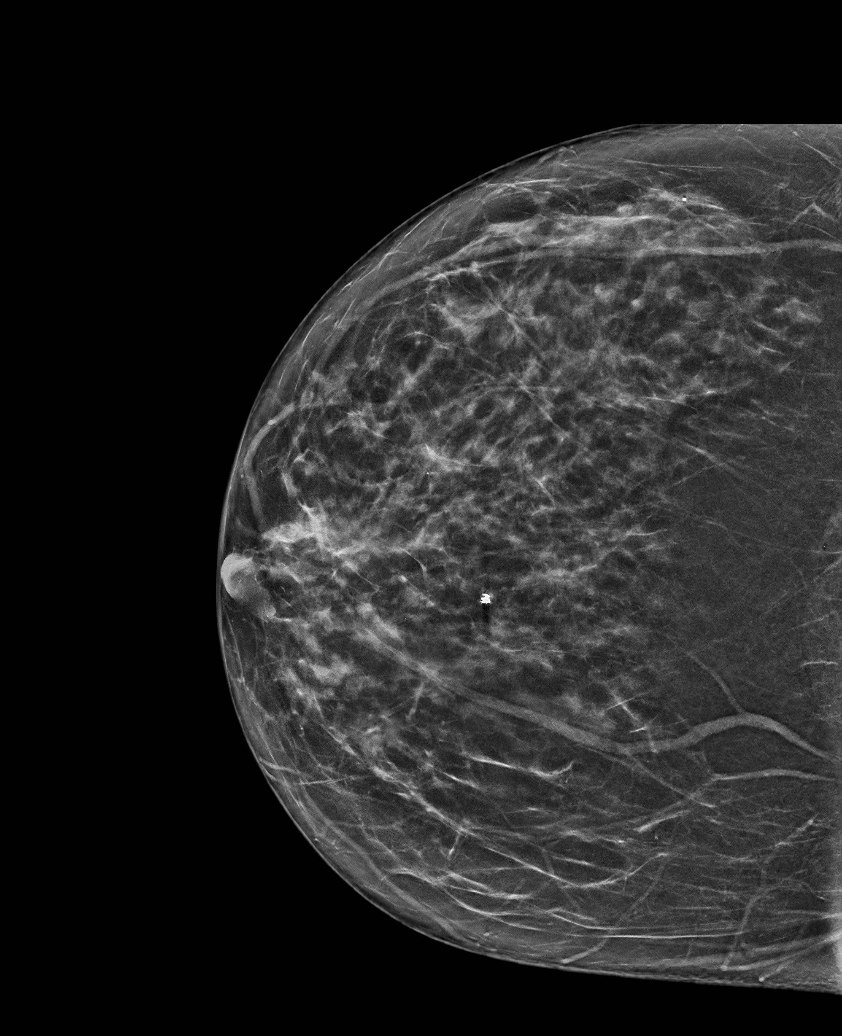

[L MLO synth-2D (1 of 2)]
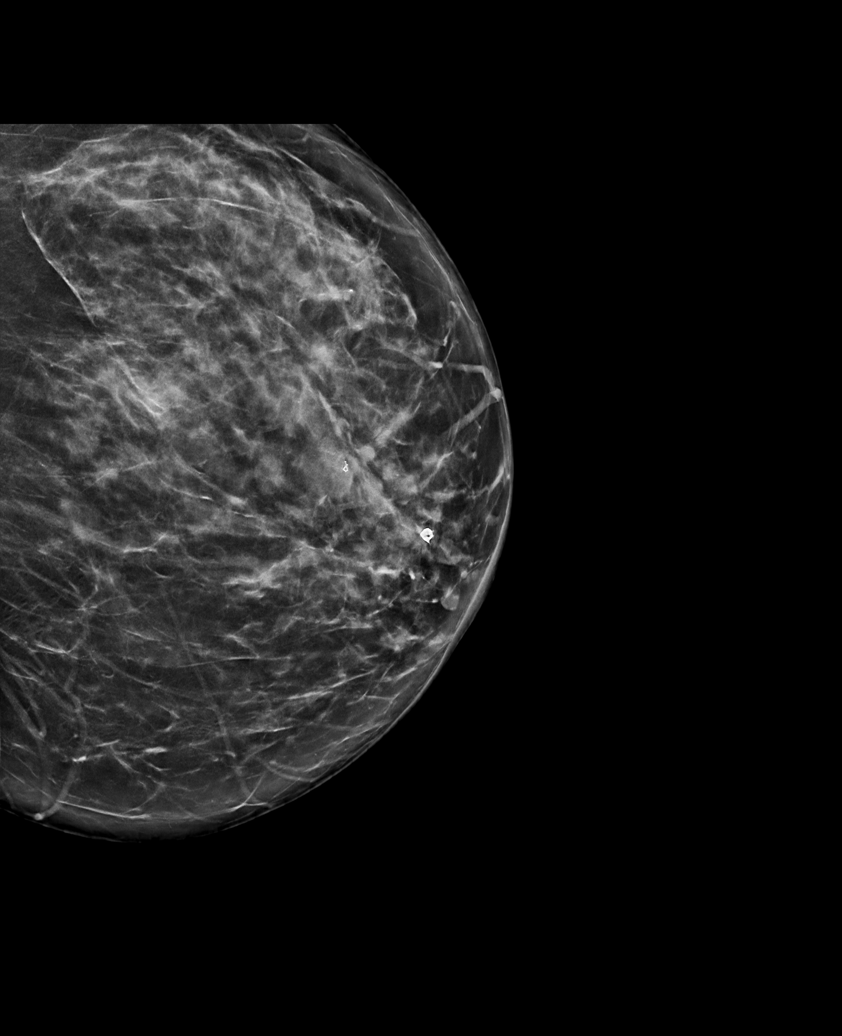

[L CC synth-2D]
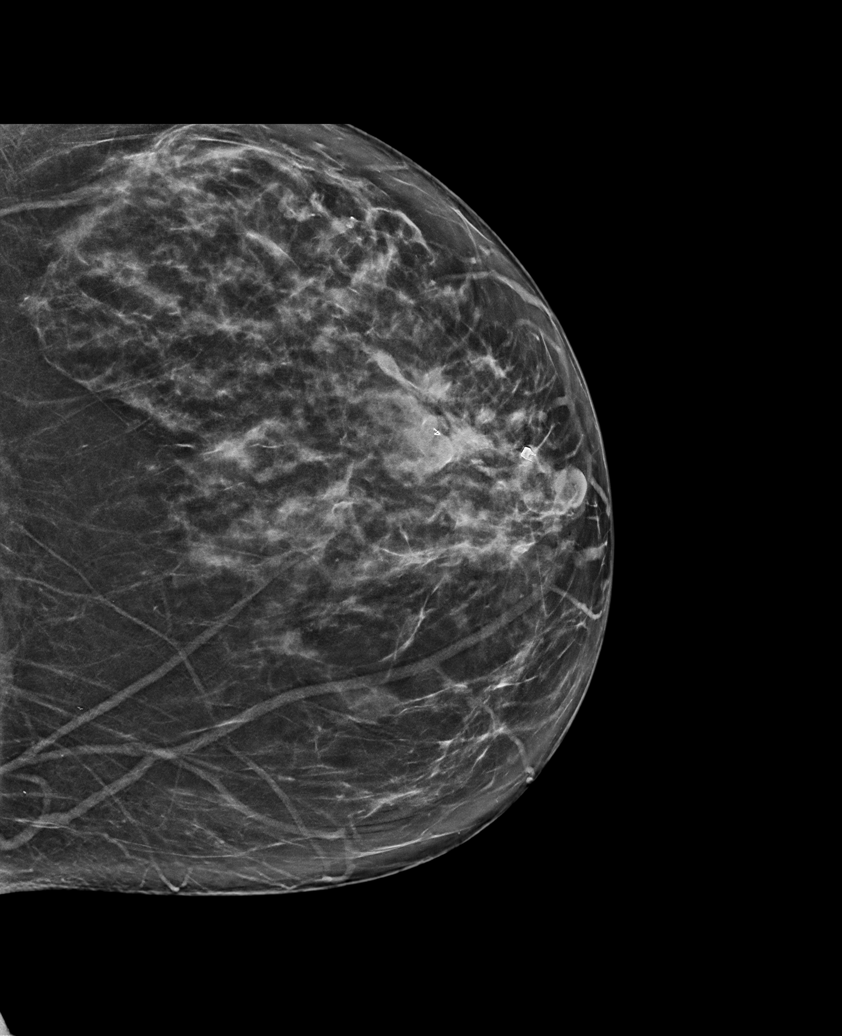

[L MLO synth-2D (2 of 2)]
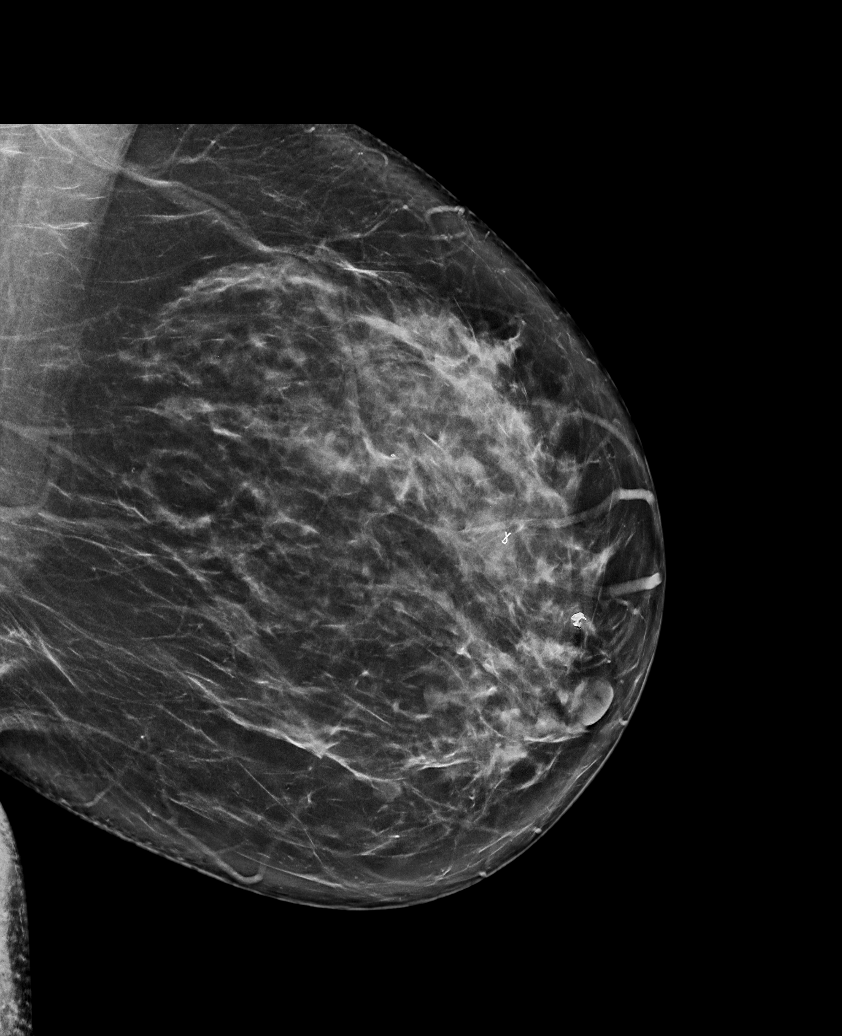

[R MLO synth-2D]
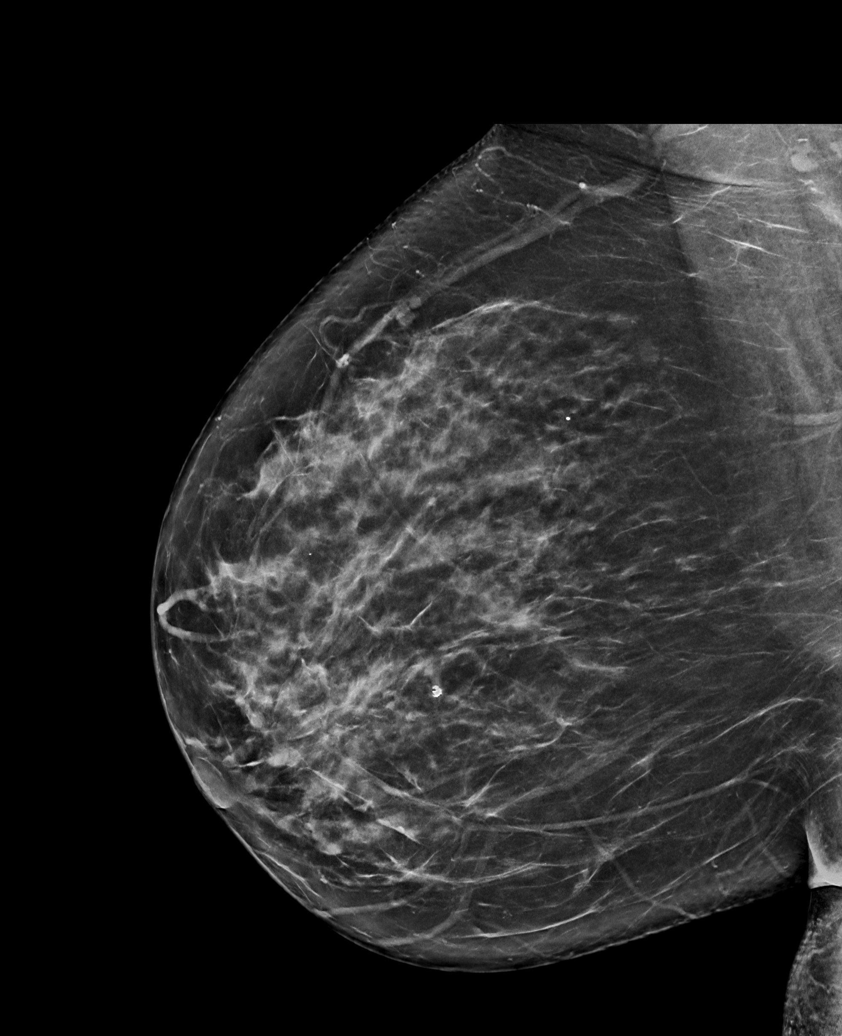

[L MLO tomo · tomo slice 45/89.0]
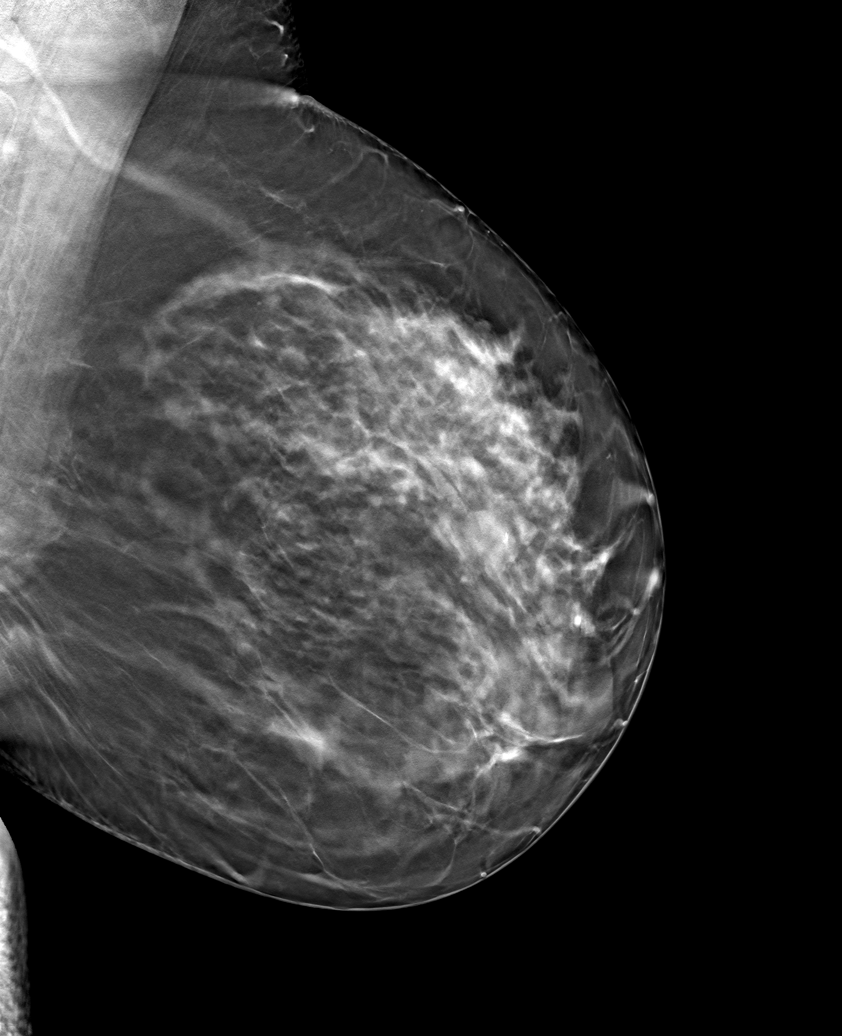

[6 of 30 positions shown; findings below may reference images not displayed]

ACR Breast Density Category c: The breast tissue is heterogeneously
dense, which may obscure small masses.
FINDINGS: Ribbon shaped biopsy clip in the upper outer left breast anterior
third. Nodularity in the central retroareolar left breast is stable.
Mild duct ectasia bilaterally. No new or suspicious mass,
architectural distortion, or suspicious microcalcification is
identified in either breast to suggest malignancy.

Mammographic images were processed with CAD.

Targeted ultrasound is performed, showing:

At [DATE] position 1 cm from the nipple is a 0.6 x 0.5 x 0.5 cm oval
hypoechoic circumscribed mass. Previously on 01/18/2017 this mass
measured 0.6 x 0.5 x 0.5 cm.

The second adjacent mass at [DATE] position 1 cm from the nipple is
oval, hypoechoic and measures 0.8 x 0.6 x 0.5 cm. Previously
01/18/2017 this mass measured 1.0 x 0.6 x 0.5 cm .

At [DATE] position 3 cm from the nipple is an oval hypoechoic mass
measuring 1.2 x 0.5 x 0.9 cm. Previously this mass measured 1.2 x
0.4 x 1.0 cm.

At 12 o'clock position 5 cm from the nipple is a circumscribed oval
hypoechoic mass measuring 0.9 x 0.4 x 0.8 cm. Previously, this mass
measured 1.0 x 0.4 x 0.9 cm.
IMPRESSION: Four stable benign-appearing masses in the left breast. These have
been followed for more than 2 years and can be considered benign.

No evidence of malignancy in either breast.

RECOMMENDATION:
Screening mammogram in one year.(Code:[id number])

I have discussed the findings and recommendations with the patient.
If applicable, a reminder letter will be sent to the patient
regarding the next appointment.

BI-RADS CATEGORY  2: Benign.

## 2020-11-21 ENCOUNTER — Telehealth: Payer: 59 | Admitting: Nurse Practitioner

## 2020-11-21 DIAGNOSIS — B001 Herpesviral vesicular dermatitis: Secondary | ICD-10-CM

## 2020-11-21 MED ORDER — VALACYCLOVIR HCL 500 MG PO TABS: ORAL_TABLET | ORAL | 1 refills | Status: AC

## 2020-11-21 NOTE — Progress Notes (Signed)
We are sorry that you are not feeling well.  Here is how we plan to help!  Based on what you have shared with me it does look like you have a viral infection.    Most cold sores or fever blisters are small fluid filled blisters around the mouth caused by herpes simplex virus.  The most common strain of the virus causing cold sores is herpes simplex virus 1.  It can be spread by skin contact, sharing eating utensils, or even sharing towels.  Cold sores are contagious to other people until dry. (Approximately 5-7 days).  Wash your hands. You can spread the virus to your eyes through handling your contact lenses after touching the lesions.  Most people experience pain at the sight or tingling sensations in their lips that may begin before the ulcers erupt.  Herpes simplex is treatable but not curable.  It may lie dormant for a long time and then reappear due to stress or prolonged sun exposure.  Many patients have success in treating their cold sores with an over the counter topical called Abreva.  You may apply the cream up to 5 times daily (maximum 10 days) until healing occurs.  If you would like to use an oral antiviral medication to speed the healing of your cold sore, I have sent a prescription to your local pharmacy Valacyclovir 500 4 tablets  At 1x and repeat in 12 hours x1  HOME CARE:   Wash your hands frequently.  Do not pick at or rub the sore.  Don't open the blisters.  Avoid kissing other people during this time.  Avoid sharing drinking glasses, eating utensils, or razors.  Do not handle contact lenses unless you have thoroughly washed your hands with soap and warm water!  Avoid oral sex during this time.  Herpes from sores on your mouth can spread to your partner's genital area.  Avoid contact with anyone who has eczema or a weakened immune system.  Cold sores are often triggered by exposure to intense sunlight, use a lip balm containing a sunscreen (SPF 30 or  higher).  GET HELP RIGHT AWAY IF:   Blisters look infected.  Blisters occur near or in the eye.  Symptoms last longer than 10 days.  Your symptoms become worse.  MAKE SURE YOU:   Understand these instructions.  Will watch your condition.  Will get help right away if you are not doing well or get worse.    Your e-visit answers were reviewed by a board certified advanced clinical practitioner to complete your personal care plan.  Depending upon the condition, your plan could have  Included both over the counter or prescription medications.    Please review your pharmacy choice.  Be sure that the pharmacy you have chosen is open so that you can pick up your prescription now.  If there is a problem you can message your provider in MyChart to have the prescription routed to another pharmacy.    Your safety is important to [place].  If you have drug allergies check our prescription carefully.  For the next 24 hours you can use MyChart to ask questions about today's visit, request a non-urgent call back, or ask for a work or school excuse from your e-visit provider.  You will get an email in the next two days asking about your experience.  I hope that your e-visit has been valuable and will speed your recovery.  5-10 minutes spent reviewing and documenting in chart.

## 2020-12-17 ENCOUNTER — Telehealth: Payer: 59 | Admitting: Physician Assistant

## 2020-12-17 DIAGNOSIS — B9689 Other specified bacterial agents as the cause of diseases classified elsewhere: Secondary | ICD-10-CM | POA: Diagnosis not present

## 2020-12-17 DIAGNOSIS — J208 Acute bronchitis due to other specified organisms: Secondary | ICD-10-CM

## 2020-12-17 MED ORDER — AZITHROMYCIN 250 MG PO TABS: ORAL_TABLET | ORAL | 0 refills | Status: AC

## 2020-12-17 MED ORDER — BENZONATATE 100 MG PO CAPS: 100.0000 mg | ORAL_CAPSULE | Freq: Three times a day (TID) | ORAL | 0 refills | Status: DC | PRN

## 2020-12-17 NOTE — Progress Notes (Signed)
We are sorry that you are not feeling well.  Here is how we plan to help!  Based on your presentation I believe you most likely have A cough due to bacteria.  If left untreated it can progress to pneumonia.  If your symptoms do not improve with your treatment plan it is important that you contact your provider.   I have prescribed Azithromyin 250 mg: two tablets now and then one tablet daily for 4 additonal days    In addition you may use A prescription cough medication called Tessalon Perles 100mg. You may take 1-2 capsules every 8 hours as needed for your cough.   From your responses in the eVisit questionnaire you describe inflammation in the upper respiratory tract which is causing a significant cough.  This is commonly called Bronchitis and has four common causes:   Allergies Viral Infections Acid Reflux Bacterial Infection Allergies, viruses and acid reflux are treated by controlling symptoms or eliminating the cause. An example might be a cough caused by taking certain blood pressure medications. You stop the cough by changing the medication. Another example might be a cough caused by acid reflux. Controlling the reflux helps control the cough.  USE OF BRONCHODILATOR ("RESCUE") INHALERS: There is a risk from using your bronchodilator too frequently.  The risk is that over-reliance on a medication which only relaxes the muscles surrounding the breathing tubes can reduce the effectiveness of medications prescribed to reduce swelling and congestion of the tubes themselves.  Although you feel brief relief from the bronchodilator inhaler, your asthma may actually be worsening with the tubes becoming more swollen and filled with mucus.  This can delay other crucial treatments, such as oral steroid medications. If you need to use a bronchodilator inhaler daily, several times per day, you should discuss this with your provider.  There are probably better treatments that could be used to keep your  asthma under control.     HOME CARE Only take medications as instructed by your medical team. Complete the entire course of an antibiotic. Drink plenty of fluids and get plenty of rest. Avoid close contacts especially the very young and the elderly Cover your mouth if you cough or cough into your sleeve. Always remember to wash your hands A steam or ultrasonic humidifier can help congestion.   GET HELP RIGHT AWAY IF: You develop worsening fever. You become short of breath You cough up blood. Your symptoms persist after you have completed your treatment plan MAKE SURE YOU  Understand these instructions. Will watch your condition. Will get help right away if you are not doing well or get worse.    Thank you for choosing an e-visit.  Your e-visit answers were reviewed by a board certified advanced clinical practitioner to complete your personal care plan. Depending upon the condition, your plan could have included both over the counter or prescription medications.  Please review your pharmacy choice. Make sure the pharmacy is open so you can pick up prescription now. If there is a problem, you may contact your provider through MyChart messaging and have the prescription routed to another pharmacy.  Your safety is important to us. If you have drug allergies check your prescription carefully.   For the next 24 hours you can use MyChart to ask questions about today's visit, request a non-urgent call back, or ask for a work or school excuse. You will get an email in the next two days asking about your experience. I hope that your e-visit   has been valuable and will speed your recovery.  

## 2020-12-17 NOTE — Progress Notes (Signed)
I have spent 5 minutes in review of e-visit questionnaire, review and updating patient chart, medical decision making and response to patient.   Kriss Perleberg Cody Agustine Rossitto, PA-C    

## 2021-04-09 ENCOUNTER — Other Ambulatory Visit: Payer: Self-pay | Admitting: Family Medicine

## 2021-04-09 ENCOUNTER — Encounter: Payer: Self-pay | Admitting: Family Medicine

## 2021-04-09 MED ORDER — NICOTINE 21 MG/24HR TD PT24: 21.0000 mg | MEDICATED_PATCH | Freq: Every day | TRANSDERMAL | 2 refills | Status: DC

## 2021-04-09 NOTE — Progress Notes (Signed)
Received the following message under her SO mychart account Hi Dr. Patsy Lager,   Brynda Greathouse was prescribed a nicotine patch to help him quit smoking.  I would also like to quit and support him changing his bad habits.  Is there a way you can call in the same prescription for me at the [facility] on [address],[place]?      My information is  STALEY BUDZINSKI birthday 12-May-1966

## 2021-11-15 ENCOUNTER — Ambulatory Visit: Payer: Self-pay

## 2022-01-26 ENCOUNTER — Other Ambulatory Visit (HOSPITAL_COMMUNITY): Payer: Self-pay | Admitting: Family Medicine

## 2022-01-26 DIAGNOSIS — Z1231 Encounter for screening mammogram for malignant neoplasm of breast: Secondary | ICD-10-CM

## 2022-02-11 ENCOUNTER — Ambulatory Visit
Admission: RE | Admit: 2022-02-11 | Discharge: 2022-02-11 | Disposition: A | Discharge status: Home / Self Care | Payer: No Typology Code available for payment source | Source: Ambulatory Visit | Attending: Family Medicine | Admitting: Family Medicine

## 2022-02-11 DIAGNOSIS — Z1231 Encounter for screening mammogram for malignant neoplasm of breast: Secondary | ICD-10-CM

## 2022-02-28 ENCOUNTER — Ambulatory Visit
Admission: EM | Admit: 2022-02-28 | Discharge: 2022-02-28 | Disposition: A | Discharge status: Home / Self Care | Payer: No Typology Code available for payment source | Attending: Physician Assistant | Admitting: Physician Assistant

## 2022-02-28 ENCOUNTER — Encounter: Payer: Self-pay | Admitting: Emergency Medicine

## 2022-02-28 DIAGNOSIS — H61002 Unspecified perichondritis of left external ear: Secondary | ICD-10-CM

## 2022-02-28 DIAGNOSIS — H61032 Chondritis of left external ear: Secondary | ICD-10-CM | POA: Diagnosis not present

## 2022-02-28 MED ORDER — CIPROFLOXACIN HCL 500 MG PO TABS: 500.0000 mg | ORAL_TABLET | Freq: Two times a day (BID) | ORAL | 0 refills | Status: DC

## 2022-02-28 NOTE — ED Triage Notes (Signed)
Patient c/o left ear swelling on the outside x 1 day.  The ear is red, swollen and painful to touch.  Patient has taken Tylenol and Aleve.

## 2022-02-28 NOTE — Discharge Instructions (Addendum)
Start ciprofloxacin twice daily for 10 days.  Follow-up with ENT.  Call them to schedule appointment soon as possible.  I would like someone to see you within the next 1 to 2 days to ensure symptom improvement.  Continue using Tylenol ibuprofen for pain.  If you have any spread of pain/redness, fever, nausea, vomiting, weakness or if anything is changing you need to go to the emergency room immediately as we discussed.

## 2022-02-28 NOTE — ED Provider Notes (Signed)
[facility]    CSN: [id number] Arrival date & time: 02/28/22  1059      History   Chief Complaint Chief Complaint  Patient presents with   Otalgia    HPI [name] is a 56 y.o. female.   Patient presents today with a 1 day history of swelling and pain of her left external ear.  She denies any known injury, insect bite, wound.  Does report that prior to symptom onset she had been swimming at a family members pool but denies additional water exposure.  She has been taking Tylenol and Aleve without improvement of symptoms.  She denies any recent illness including cough, congestion, fever, nausea, vomiting, fever.  Reports pain is rated 4 on a 0-10 pain scale, described as aching, worse with palpation surrounding ear, no alleviating factors identified.  Denies history of diabetes or immunosuppression.    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) 04/13/2012   Esophageal reflux 04/13/2012    Past Surgical History:  Procedure Laterality Date   ABDOMINAL HYSTERECTOMY  11/2010   CERVICAL DISC SURGERY     COLONOSCOPY  04/13/2012   Procedure: COLONOSCOPY;  Surgeon: Hart Carwin, MD;  Location: [facility];  Service: Endoscopy;  Laterality: N/A;   ESOPHAGOGASTRODUODENOSCOPY  04/13/2012   Procedure: ESOPHAGOGASTRODUODENOSCOPY (EGD);  Surgeon: Hart Carwin, MD;  Location: [facility];  Service: Endoscopy;  Laterality: N/A;   LAPAROSCOPY  1990   For endometriosis    SHOULDER ARTHROSCOPY     TUBAL LIGATION  1999    OB History   No obstetric history on file.      Home Medications    Prior to Admission medications   Medication Sig Start  Date End Date Taking? Authorizing Provider  ciprofloxacin (CIPRO) 500 MG tablet Take 1 tablet (500 mg total) by mouth every 12 (twelve) hours. 02/28/22  Yes Ziair Penson K, PA-C  valACYclovir (VALTREX) 500 MG tablet TAKE 4 TABLETS(2000 MG) BY MOUTH 1 TIME. REPEAT 4 PILLS IN 12 HOURS AS NEEDED FOR COLD SORE 11/21/20  Yes Daphine Deutscher Mary-Margaret, FNP    Family History Family History  Problem Relation Age of Onset   Ulcerative colitis Maternal Grandfather    Heart disease Maternal Grandfather    Esophageal cancer Paternal Grandfather    Heart disease Maternal Grandmother    Healthy Mother    Healthy Father    Colon cancer Neg Hx     Social History Social History   Tobacco Use   Smoking status: Every Day    Packs/day: 0.50    Types: Cigarettes   Smokeless tobacco: Never  Substance Use Topics   Alcohol use: No   Drug use: No     Allergies   Sulfa antibiotics   Review of Systems Review of Systems  Constitutional:  Negative for activity change, appetite change, fatigue and fever.  HENT:  Positive for ear pain. Negative for congestion, sinus pressure, sneezing and sore throat.   Respiratory:  Negative for cough and shortness of breath.   Cardiovascular:  Negative for chest pain.  Gastrointestinal:  Negative for abdominal pain, diarrhea, nausea and  vomiting.  Neurological:  Negative for dizziness, light-headedness and headaches.     Physical Exam Triage Vital Signs ED Triage Vitals  Enc Vitals Group     BP 02/28/22 1242 (!) 151/95     Pulse Rate 02/28/22 1242 (!) 59     Resp 02/28/22 1242 18     Temp 02/28/22 1242 98.2 F (36.8 C)     Temp Source 02/28/22 1242 Oral     SpO2 02/28/22 1242 96 %     Weight 02/28/22 1243 185 lb (83.9 kg)     Height 02/28/22 1243 5\' 5"  (1.651 m)     Head Circumference --      Peak Flow --      Pain Score 02/28/22 1243 3     Pain Loc --      Pain Edu? --      Excl. in GC? --    No data found.  Updated Vital Signs BP (!) 151/95 (BP  Location: Right Arm)   Pulse (!) 59   Temp 98.2 F (36.8 C) (Oral)   Resp 18   Ht 5\' 5"  (1.651 m)   Wt 185 lb (83.9 kg)   SpO2 96%   BMI 30.79 kg/m   Visual Acuity Right Eye Distance:   Left Eye Distance:   Bilateral Distance:    Right Eye Near:   Left Eye Near:    Bilateral Near:     Physical Exam Vitals reviewed.  Constitutional:      General: She is awake. She is not in acute distress.    Appearance: Normal appearance. She is well-developed. She is not ill-appearing.     Comments: Very pleasant female appears stated age no acute distress sitting comfortably in exam room  HENT:     Head: Normocephalic and atraumatic.     Right Ear: Tympanic membrane, ear canal and external ear normal. Tympanic membrane is not erythematous or bulging.     Left Ear: Tympanic membrane, ear canal and external ear normal. Swelling and tenderness present. Tympanic membrane is not erythematous or bulging.     Ears:     Comments: Erythema and swelling noted of left pinna.  Tenderness palpation surrounding ear but no pain over mastoid.    Mouth/Throat:     Pharynx: Uvula midline. No oropharyngeal exudate or posterior oropharyngeal erythema.  Cardiovascular:     Rate and Rhythm: Normal rate and regular rhythm.     Heart sounds: Normal heart sounds, S1 normal and S2 normal. No murmur heard. Pulmonary:     Effort: Pulmonary effort is normal.     Breath sounds: Normal breath sounds. No wheezing, rhonchi or rales.     Comments: Clear to auscultation bilaterally Lymphadenopathy:     Head:     Right side of head: No submental, submandibular or tonsillar adenopathy.     Left side of head: Posterior auricular adenopathy present. No submental, submandibular, tonsillar or preauricular adenopathy.     Cervical: No cervical adenopathy.  Psychiatric:        Behavior: Behavior is cooperative.      UC Treatments / Results  Labs (all labs ordered are listed, but only abnormal results are  displayed) Labs Reviewed - No data to display  EKG   Radiology No results found.  Procedures Procedures (including critical care time)  Medications Ordered in UC Medications - No data to display  Initial Impression / Assessment and Plan / UC Course  I have reviewed the triage vital signs and  the nursing notes.  Pertinent labs & imaging results that were available during my care of the patient were reviewed by me and considered in my medical decision making (see chart for details).     Symptoms consistent with perichondritis.  Concern for small wound prior to patient going swimming that allowed for development of infection.  No obvious wound or hematoma noted.  We will start ciprofloxacin 500 mg twice daily for 10 days.  Discussed that she can continue using over-the-counter medication for symptom management.  Recommend close follow-up with ENT she was given contact information for local provider with instruction to call to schedule an appointment.  If she has any worsening symptoms including spread of pain, swelling of the ear, fever, nausea, vomiting, weakness she needs to go to the emergency room immediately to which she expressed understanding.  Strict return precautions given.  Offered work excuse note which she declined.  Final Clinical Impressions(s) / UC Diagnoses   Final diagnoses:  Perichondritis and chondritis of left pinna     Discharge Instructions      Start ciprofloxacin twice daily for 10 days.  Follow-up with ENT.  Call them to schedule appointment soon as possible.  I would like someone to see you within the next 1 to 2 days to ensure symptom improvement.  Continue using Tylenol ibuprofen for pain.  If you have any spread of pain/redness, fever, nausea, vomiting, weakness or if anything is changing you need to go to the emergency room immediately as we discussed.     ED Prescriptions     Medication Sig Dispense Auth. Provider   ciprofloxacin (CIPRO) 500 MG  tablet Take 1 tablet (500 mg total) by mouth every 12 (twelve) hours. 20 tablet Teshia Mahone, Noberto Retort, PA-C      PDMP not reviewed this encounter.   Jeani Hawking, PA-C 02/28/22 1318

## 2023-04-12 ENCOUNTER — Emergency Department (HOSPITAL_COMMUNITY): Payer: Commercial Managed Care - PPO

## 2023-04-12 ENCOUNTER — Encounter (HOSPITAL_COMMUNITY): Payer: Self-pay

## 2023-04-12 ENCOUNTER — Emergency Department (HOSPITAL_COMMUNITY)
Admission: EM | Admit: 2023-04-12 | Discharge: 2023-04-12 | Disposition: A | Discharge status: Home / Self Care | Payer: Commercial Managed Care - PPO | Attending: Student | Admitting: Student

## 2023-04-12 ENCOUNTER — Other Ambulatory Visit: Payer: Self-pay

## 2023-04-12 DIAGNOSIS — R079 Chest pain, unspecified: Secondary | ICD-10-CM

## 2023-04-12 DIAGNOSIS — R0789 Other chest pain: Secondary | ICD-10-CM | POA: Diagnosis present

## 2023-04-12 DIAGNOSIS — R059 Cough, unspecified: Secondary | ICD-10-CM | POA: Insufficient documentation

## 2023-04-12 DIAGNOSIS — R202 Paresthesia of skin: Secondary | ICD-10-CM | POA: Insufficient documentation

## 2023-04-12 DIAGNOSIS — R200 Anesthesia of skin: Secondary | ICD-10-CM | POA: Diagnosis not present

## 2023-04-12 DIAGNOSIS — M542 Cervicalgia: Secondary | ICD-10-CM | POA: Insufficient documentation

## 2023-04-12 LAB — TROPONIN I (HIGH SENSITIVITY)
Troponin I (High Sensitivity): 8 ng/L (ref <18)
Troponin I (High Sensitivity): 8 ng/L (ref <18)

## 2023-04-12 LAB — D-DIMER, QUANTITATIVE: D-Dimer, Quant: <0.27 ug{FEU}/mL (ref 0.00–0.50)

## 2023-04-12 LAB — BASIC METABOLIC PANEL
Anion gap: 12 (ref 5–15)
BUN: 22 mg/dL — ABNORMAL HIGH (ref 6–20)
CO2: 24 mmol/L (ref 22–32)
Calcium: 8.8 mg/dL — ABNORMAL LOW (ref 8.9–10.3)
Chloride: 101 mmol/L (ref 98–111)
Creatinine, Ser: 0.82 mg/dL (ref 0.44–1.00)
GFR, Estimated: >60 mL/min (ref >60)
Glucose, Bld: 95 mg/dL (ref 70–99)
Potassium: 3.3 mmol/L — ABNORMAL LOW (ref 3.5–5.1)
Sodium: 137 mmol/L (ref 135–145)

## 2023-04-12 LAB — CBC
HCT: 46.8 % — ABNORMAL HIGH (ref 36.0–46.0)
Hemoglobin: 15.7 g/dL — ABNORMAL HIGH (ref 12.0–15.0)
MCH: 30.1 pg (ref 26.0–34.0)
MCHC: 33.5 g/dL (ref 30.0–36.0)
MCV: 89.7 fL (ref 80.0–100.0)
Platelets: 308 10*3/uL (ref 150–400)
RBC: 5.22 MIL/uL — ABNORMAL HIGH (ref 3.87–5.11)
RDW: 13.2 % (ref 11.5–15.5)
WBC: 15.7 10*3/uL — ABNORMAL HIGH (ref 4.0–10.5)
nRBC: 0 % (ref 0.0–0.2)

## 2023-04-12 LAB — MAGNESIUM: Magnesium: 2.4 mg/dL (ref 1.7–2.4)

## 2023-04-12 MED ORDER — ALBUTEROL SULFATE HFA 108 (90 BASE) MCG/ACT IN AERS: 2.0000 | INHALATION_SPRAY | RESPIRATORY_TRACT | Status: DC | PRN

## 2023-04-12 NOTE — ED Triage Notes (Signed)
Pt states she is having SOB, chest pain, pain going up the right side of her neck, bilateral arm & feet tingling.

## 2023-04-12 NOTE — Discharge Instructions (Signed)
Discontinue your remaining prednisone.  Apply ice and alternate with heat to your neck.  Please follow-up with your primary care provider for recheck.  Return to the emergency department for any new or worsening symptoms.

## 2023-04-14 NOTE — ED Provider Notes (Signed)
[place] EMERGENCY DEPARTMENT AT [facility] Provider Note   CSN: [id number] Arrival date & time: 04/12/23  1626     History  Chief Complaint  Patient presents with   Chest Pain    [name] is a 57 y.o. female.   Chest Pain Associated symptoms: cough and shortness of breath   Associated symptoms: no abdominal pain, no back pain, no dizziness, no dysphagia, no fever, no headache, no nausea, no numbness, no vomiting and no weakness        [name] is a 57 y.o. female past medical history of ulcerative colitis and esophageal reflux who presents to the Emergency Department complaining of right sided neck pain, shortness of breath, chest pain and numbness and tingling to her bilateral arms and feet.  Symptoms present for several hours.  No history of cardiac disease.  States she was recently treated for bronchitis, received steroid injection in the office recently finished a Z-Pak and is currently taking a steroid taper pack.  Continues to have occasional nonproductive cough but states overall her cough has improved.  Denies any fever.  Uses inhaler as needed.   Home Medications Prior to Admission medications   Medication Sig Start Date End Date Taking? Authorizing Provider  ciprofloxacin (CIPRO) 500 MG tablet Take 1 tablet (500 mg total) by mouth every 12 (twelve) hours. 02/28/22   Raspet, Noberto Retort, PA-C  valACYclovir (VALTREX) 500 MG tablet TAKE 4 TABLETS(2000 MG) BY MOUTH 1 TIME. REPEAT 4 PILLS IN 12 HOURS AS NEEDED FOR COLD SORE 11/21/20   Bennie Pierini, FNP      Allergies    Sulfa antibiotics    Review of Systems   Review of Systems  Constitutional:  Negative for appetite change, chills and fever.  HENT:  Negative for sore throat and trouble swallowing.   Respiratory:  Positive for cough and shortness of breath.   Cardiovascular:  Positive for chest pain. Negative for leg swelling.  Gastrointestinal:  Negative for abdominal pain, diarrhea,  nausea and vomiting.  Genitourinary:  Negative for dysuria and flank pain.  Musculoskeletal:  Positive for neck pain (Right neck pain). Negative for back pain.  Skin:  Negative for rash.  Neurological:  Negative for dizziness, weakness, numbness and headaches.  Psychiatric/Behavioral:  Negative for confusion.     Physical Exam Updated Vital Signs BP (!) 165/107   Pulse (!) 54   Temp 98.4 F (36.9 C) (Oral)   Resp 16   Ht 5\' 5"  (1.651 m)   Wt 88.5 kg   SpO2 98%   BMI 32.45 kg/m  Physical Exam Vitals and nursing note reviewed.  Constitutional:      General: She is not in acute distress.    Appearance: She is well-developed. She is not ill-appearing or toxic-appearing.  HENT:     Mouth/Throat:     Mouth: Mucous membranes are moist.  Neck:     Trachea: Phonation normal.     Comments: Tender to palpation right cervical paraspinal muscles.  No midline tenderness or bony step-off. Cardiovascular:     Rate and Rhythm: Normal rate and regular rhythm.     Pulses: Normal pulses.  Pulmonary:     Effort: Pulmonary effort is normal.  Abdominal:     Palpations: Abdomen is soft.     Tenderness: There is no abdominal tenderness.  Musculoskeletal:        General: No swelling.     Cervical back: No rigidity. Muscular tenderness present.  Right lower leg: No edema.     Left lower leg: No edema.  Skin:    General: Skin is warm.     Capillary Refill: Capillary refill takes less than 2 seconds.  Neurological:     General: No focal deficit present.     Mental Status: She is alert.     Sensory: No sensory deficit.     Motor: No weakness.     ED Results / Procedures / Treatments   Labs (all labs ordered are listed, but only abnormal results are displayed) Labs Reviewed  BASIC METABOLIC PANEL - Abnormal; Notable for the following components:      Result Value   Potassium 3.3 (*)    BUN 22 (*)    Calcium 8.8 (*)    All other components within normal limits  CBC - Abnormal;  Notable for the following components:   WBC 15.7 (*)    RBC 5.22 (*)    Hemoglobin 15.7 (*)    HCT 46.8 (*)    All other components within normal limits  D-DIMER, QUANTITATIVE  MAGNESIUM  TROPONIN I (HIGH SENSITIVITY)  TROPONIN I (HIGH SENSITIVITY)    EKG EKG Interpretation Date/Time:  Tuesday April 12 2023 16:41:04 EDT Ventricular Rate:  67 PR Interval:  140 QRS Duration:  88 QT Interval:  416 QTC Calculation: 439 R Axis:   15  Text Interpretation: Normal sinus rhythm Cannot rule out Anterior infarct (cited on or before 18-Mar-2016) Abnormal ECG When compared with ECG of 18-Mar-2016 15:54, No significant change was found Confirmed by Susy Frizzle [phone]) on 04/13/2023 10:34:48 AM  Radiology DG Chest 2 View  Result Date: 04/12/2023 CLINICAL DATA:  Chest tightness. EXAM: CHEST - 2 VIEW COMPARISON:  03/18/2016 FINDINGS: The cardiomediastinal contours are normal. The lungs are clear. Pulmonary vasculature is normal. No consolidation, pleural effusion, or pneumothorax. Thoracic spondylosis. No acute osseous abnormalities are seen. IMPRESSION: No active cardiopulmonary disease. Electronically Signed   By: Narda Rutherford M.D.   On: 04/12/2023 20:13    Procedures Procedures    Medications Ordered in ED Medications - No data to display  ED Course/ Medical Decision Making/ A&P                                 Medical Decision Making Patient here for evaluation of chest pain shortness of breath pain of the right neck with movement or palpation tingling of her bilateral hands and feet.  Currently taking steroid taper completed antibiotic course for bronchitis    I suspect musculoskeletal pain of her right neck.  No meningeal signs no focal neurodeficits on exam.  Symptoms atypical of ACS, pneumonia PE also considered but felt less likely.  I suspect side effects of her recent steroids.  Amount and/or Complexity of Data Reviewed Labs: ordered.    Details: Labs overall  reassuring.  She does have elevated white count I suspect this is related to steroids.  She has been afebrile.  Troponin unremarkable as well as D-dimer.  Magnesium unremarkable. Radiology: ordered.    Details: Chest x-ray without acute cardiopulmonary process. ECG/medicine tests: ordered.    Details: EKG shows sinus rhythm.  No significant change from previous EKG Discussion of management or test interpretation with external provider(s): I suspect patient's symptoms secondary to recent steroid use.  Workup without evidence of PE, ACS or pneumonia.  Right neck pain possible related to torticollis.  Patient felt to be appropriate  for discharge home, will follow-up closely with PCP.  Return precautions were also given.           Final Clinical Impression(s) / ED Diagnoses Final diagnoses:  Nonspecific chest pain    Rx / DC Orders ED Discharge Orders     None         Pauline Aus, PA-C 04/14/23 1147    Kommor, Wyn Forster, MD 04/14/23 1753

## 2023-10-27 NOTE — Therapy (Signed)
 OUTPATIENT PHYSICAL THERAPY THORACOLUMBAR EVALUATION   Patient Name: [name] MRN: [id number] DOB:1965/12/08, 58 y.o., female Today's Date: 10/28/2023  END OF SESSION:  PT End of Session - 10/28/23 1106     Visit Number 1    Number of Visits 9    Date for PT Re-Evaluation 11/25/23    Authorization Type [employer] Medicare    Authorization Time Period No auth    Progress Note Due on Visit 9    PT Start Time 1106    PT Stop Time 1145    PT Time Calculation (min) 39 min    Activity Tolerance Patient tolerated treatment well    Behavior During Therapy WFL for tasks assessed/performed             Past Medical History:  Diagnosis Date   Asthma    Endometriosis    Eosinophilic esophagitis    Feline esophagus    GERD (gastroesophageal reflux disease)    HSV-2 (herpes simplex virus 2) infection    Hyperlipidemia    Psoriasis    Ulcerative colitis (HCC)    Past Surgical History:  Procedure Laterality Date   ABDOMINAL HYSTERECTOMY  11/2010   CERVICAL DISC SURGERY     COLONOSCOPY  04/13/2012   Procedure: COLONOSCOPY;  Surgeon: Pietro Bridegroom, MD;  Location: [facility];  Service: Endoscopy;  Laterality: N/A;   ESOPHAGOGASTRODUODENOSCOPY  04/13/2012   Procedure: ESOPHAGOGASTRODUODENOSCOPY (EGD);  Surgeon: Pietro Bridegroom, MD;  Location: [facility];  Service: Endoscopy;  Laterality: N/A;   LAPAROSCOPY  1990   For endometriosis    SHOULDER ARTHROSCOPY     TUBAL LIGATION  1999   Patient Active Problem List   Diagnosis Date Noted   Universal 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) 04/13/2012   Esophageal reflux 04/13/2012    PCP: Minus Amel, MD  REFERRING PROVIDER: Mort Ards, MD  REFERRING DIAG:    M54.50 (ICD-10-CM) - Low back pain, unspecified    Rationale for Evaluation and Treatment: Rehabilitation  THERAPY DIAG:  Other low back pain  Decreased ROM of lumbar spine  Impaired functional mobility, balance, gait, and endurance  ONSET DATE: 2 weeks    SUBJECTIVE:                                                                                                                                                                                           SUBJECTIVE STATEMENT: Report she bent down to get a pan and felt a snap, been in pain since. Went to chiropractor 5x and been icing, but has been getting worse. She went to see ortho MD. Got x-rays. (Can't view, went to [facility], [place])  Reports MD thought she is having issue with SIJ and that lumbar spine/vertebrae were in good shape. Reports Wednesday she could hardly get out of bed. Today is much better but still having pain, especially with STS. 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  PERTINENT HISTORY:  Previous neck, and shoulder surgeries, RTC repair  PAIN:  Are you having pain? Yes: NPRS scale: 5/10 Pain location: R low back and R glute region Pain description: Sharp/shooting Aggravating factors: Lifting leg, neck flexion, transfers  Relieving factors: Rest, Turmeric, tylenol , icing  PRECAUTIONS: None  RED FLAGS: N/T down RLE down to toes, anterior, comes and goes  WEIGHT BEARING RESTRICTIONS: No  FALLS:  Has patient fallen in last 6 months? 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  DIAGNOSTIC FINDINGS:  N/A  PATIENT SURVEYS:  Modified Oswestry Modified Oswestry Low Back Pain Disability Questionnaire: 31 / 50 = 62.0 %   COGNITION: Overall cognitive status: Within functional limits for tasks assessed     SENSATION: WFL  POSTURE: rounded shoulders, forward head, increased thoracic kyphosis, and weight shift right  PALPATION: TTP t/o lumbar SP only with grade  2-3 CPA  LUMBAR ROM:   AROM eval  Flexion 25% avail *  Extension 50% avail  Right lateral flexion To knee jt  Left lateral flexion To knee jt, pulling/stretching sensation, not pain  Right rotation WNL  Left rotation WNL   (Blank rows = not tested)  *= pain in R low back   LOWER EXTREMITY ROM:     Active  Right eval Left eval  Hip flexion    Hip extension    Hip abduction    Hip adduction    Hip internal rotation    Hip external rotation    Knee flexion    Knee extension    Ankle dorsiflexion    Ankle plantarflexion    Ankle inversion    Ankle eversion     (Blank rows = not tested)  * =pain in R low back   LOWER EXTREMITY MMT:    MMT Right eval Left eval  Hip flexion 3+ * 3+ *  Hip extension 4- 4-  Hip abduction 4 4  Hip adduction    Hip internal rotation    Hip external rotation    Knee flexion 4 4  Knee extension 4 4  Ankle dorsiflexion    Ankle plantarflexion    Ankle inversion    Ankle eversion     (Blank rows = not tested)  LUMBAR SPECIAL TESTS:  Straight leg raise test:  , Slump test:  , and SI Compression/distraction test: Negative Sacral thrust: negative  FUNCTIONAL TESTS:  2 minute walk test: 374, reports pain in R side low back initially, improves t/o test but still remains t/o test 30 seconds chair stand test: 6 STS, painful t/o test in R low back, sharp pain  GAIT: Distance walked: 374 Assistive device utilized: None Level of assistance: Complete Independence Comments: Good speed and step length bilaterally, Mild R lateral lean t/o  TREATMENT DATE:  10/28/23: PT Eval and HEP  PATIENT EDUCATION:  Education details: PT evaluation, objective findings, POC, Importance of HEP, Precautions, Clinic policies  Person educated: Patient Education method: Explanation and Demonstration Education comprehension: verbalized  understanding and returned demonstration  HOME EXERCISE PROGRAM: Access Code: [email or web address] URL: [email or web address] Date: 10/28/2023 Prepared by: Virgia Griffins Powell-Butler  Exercises - Prone Press Up On Elbows  - 2 x daily - 7 x weekly - 10 reps - 10 hold - Supine Piriformis Stretch with Foot on Ground  - 2 x daily - 7 x weekly - 2 sets - 30 hold, FOCUS ON R PIRIFORMIS  - Supine Lower Trunk Rotation  - 2 x daily - 7 x weekly - 2 sets - 10 reps , KNEES TO LEFT ONLY - Supine Transversus Abdominis Bracing - Hands on Stomach  - 2 x daily - 7 x weekly - 2 sets - 10 reps - 10 hold  ASSESSMENT:  CLINICAL IMPRESSION: Patient is a 58 y.o. female who was seen today for physical therapy evaluation and treatment for  Diagnosis  M54.50 (ICD-10-CM) - Low back pain, unspecified  . Patient reports to PT evaluation with reports of increased low back pain over the last two weeks that increases with forward flexion, hip flexion, and sit to stand transfer. Pain will intermittently travel down RLE to toes at times. Unable to reproduce this pain during today's evaluation but can reproduce patients familiar R low back pain with some motions/testing (see above). SIJ compression and sacral thrust testing negative. On this date, patient demonstrates decreased lumbar ROM, dec LE strength due to pain in low back, and tenderness with grade 2-3 CPA glides in lumbar spine which all may be contributing to patient increased pain, altered gait, and overall impaired function. Patient will benefit from skilled physical therapy in order to address the above deficits to improve overall function.    OBJECTIVE IMPAIRMENTS: Abnormal gait, decreased activity tolerance, decreased endurance, decreased mobility, difficulty walking, decreased ROM, decreased strength, hypomobility, postural dysfunction, and pain.   ACTIVITY LIMITATIONS: lifting, bending, sitting, standing, squatting, stairs, and transfers  PARTICIPATION  LIMITATIONS: cleaning, laundry, community activity, occupation, and yard work  [facility] POTENTIAL: Good  CLINICAL DECISION MAKING: Stable/uncomplicated  EVALUATION COMPLEXITY: Low   GOALS: Goals reviewed with patient? No  SHORT TERM GOALS: Target date: 11/11/23 Patient will be independent with performance of HEP to demonstrate adequate self management of symptoms.  Baseline:  Goal status: INITIAL  2.   Patient will report at least a 25% improvement with function or pain overall since beginning PT. Baseline:  Goal status: INITIAL   LONG TERM GOALS: Target date: 11/25/23 Patient will improve Oswestry score by  20% in order to improve self-perceived disability and overall function.  Baseline: Goal status: INITIAL   2.  Patient will improve test by at least 50 more ft  in order to demonstrate improved overall endurance.  Baseline: Goal status: INITIAL   3.  Patient will improve  30 second chair rise  test to  at least 8 STS in order to improve LE strength and endurance for improved gait mechanics. Baseline:  Goal status: INITIAL   4.  Patient will gain at least  25% more AROM in lumbar flexion  in order to be able to reach to ground level without increased pain. Baseline:  Goal status: INITIAL   5.  Patient will report overall 50% improvement with daily pain, reporting 3/0 or less, since beginning PT. Baseline:  Goal status: INITIAL   PLAN:  PT FREQUENCY: 1-2x/week  PT  DURATION: 4 weeks  PLANNED INTERVENTIONS: 97164- PT Re-evaluation, 97110-Therapeutic exercises, 97530- Therapeutic activity, [id number]- Neuromuscular re-education, 97535- Self Care, 16109- Manual therapy, [id number]- Gait training, [phone]- Electrical stimulation (manual), [phone]- Traction (mechanical), Patient/Family education, Balance training, Stair training, Dry Needling, Joint mobilization, Spinal mobilization, and Moist heat.  PLAN FOR NEXT SESSION: SLR/slump test, progress lumbar mobility, spinal decompression  activities, core strengthening   12:41 PM, 10/28/23 Chadwick Reiswig Powell-Butler, PT, DPT [facility] [place]

## 2023-10-28 ENCOUNTER — Other Ambulatory Visit: Payer: Self-pay

## 2023-10-28 ENCOUNTER — Encounter (HOSPITAL_COMMUNITY): Payer: Self-pay

## 2023-10-28 ENCOUNTER — Ambulatory Visit (HOSPITAL_COMMUNITY): Attending: Orthopedic Surgery

## 2023-10-28 DIAGNOSIS — M5459 Other low back pain: Secondary | ICD-10-CM | POA: Diagnosis present

## 2023-10-28 DIAGNOSIS — Z7409 Other reduced mobility: Secondary | ICD-10-CM | POA: Insufficient documentation

## 2023-10-28 DIAGNOSIS — M5386 Other specified dorsopathies, lumbar region: Secondary | ICD-10-CM | POA: Insufficient documentation

## 2023-11-01 ENCOUNTER — Encounter (HOSPITAL_COMMUNITY)

## 2023-11-08 ENCOUNTER — Ambulatory Visit (HOSPITAL_COMMUNITY)

## 2023-11-08 ENCOUNTER — Encounter (HOSPITAL_COMMUNITY): Payer: Self-pay

## 2023-11-08 DIAGNOSIS — M5386 Other specified dorsopathies, lumbar region: Secondary | ICD-10-CM

## 2023-11-08 DIAGNOSIS — M5459 Other low back pain: Secondary | ICD-10-CM | POA: Diagnosis not present

## 2023-11-08 DIAGNOSIS — Z7409 Other reduced mobility: Secondary | ICD-10-CM

## 2023-11-08 NOTE — Therapy (Addendum)
 OUTPATIENT PHYSICAL THERAPY THORACOLUMBAR TREATMENT   Patient Name: [name] MRN: [id number] DOB:09/06/1965, 58 y.o., female Today's Date: 11/08/2023  PHYSICAL THERAPY DISCHARGE SUMMARY  Visits from Start of Care: 2  Current functional level related to goals / functional outcomes: See below   Remaining deficits: See below   Education / Equipment: See below   Patient agrees to discharge. Patient goals were met. Patient is being discharged due to meeting the stated rehab goals. And being pleased with current functional level.    END OF SESSION:  PT End of Session - 11/08/23 1553     Visit Number 2    Number of Visits 9    Date for PT Re-Evaluation 11/25/23    Authorization Type [employer]    Authorization Time Period No auth    Progress Note Due on Visit 9    PT Start Time 1525    PT Stop Time 1553    PT Time Calculation (min) 28 min    Activity Tolerance Patient tolerated treatment well    Behavior During Therapy WFL for tasks assessed/performed              Past Medical History:  Diagnosis Date   Asthma    Endometriosis    Eosinophilic esophagitis    Feline esophagus    GERD (gastroesophageal reflux disease)    HSV-2 (herpes simplex virus 2) infection    Hyperlipidemia    Psoriasis    Ulcerative colitis (HCC)    Past Surgical History:  Procedure Laterality Date   ABDOMINAL HYSTERECTOMY  11/2010   CERVICAL DISC SURGERY     COLONOSCOPY  04/13/2012   Procedure: COLONOSCOPY;  Surgeon: Pietro Bridegroom, MD;  Location: [facility];  Service: Endoscopy;  Laterality: N/A;   ESOPHAGOGASTRODUODENOSCOPY  04/13/2012   Procedure: ESOPHAGOGASTRODUODENOSCOPY (EGD);  Surgeon: Pietro Bridegroom, MD;  Location: [facility];  Service: Endoscopy;  Laterality: N/A;   LAPAROSCOPY  1990   For endometriosis    SHOULDER ARTHROSCOPY     TUBAL LIGATION  1999   Patient Active Problem List   Diagnosis Date Noted   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  Reports she has been compliant with HEP and chiropractor gave stretches.  Returns to [facility] next Tuesday.  Eval:  Report she bent down to get a pan and felt a snap, been in pain since. Went to chiropractor 5x and been icing, but has been getting worse. She went to see ortho MD. Got x-rays. (Can't view, went to [facility]) Reports MD thought she is having issue with SIJ and that lumbar spine/vertebrae were in good shape. Reports Wednesday she could hardly get out of bed. Today is much better but still having pain, especially with STS. 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  PERTINENT HISTORY:  Previous neck, and shoulder surgeries, RTC repair  PAIN:  Are you having pain? Yes: NPRS scale: 5/10 Pain location: R low back and R glute region Pain description: Sharp/shooting Aggravating factors: Lifting leg, neck flexion, transfers  Relieving factors: Rest, Turmeric, tylenol , icing  PRECAUTIONS: None  RED FLAGS: N/T down RLE down to toes, anterior, comes and goes  WEIGHT BEARING RESTRICTIONS: No  FALLS:  Has patient fallen in last 6 months? 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  DIAGNOSTIC FINDINGS:  N/A  PATIENT SURVEYS:  Modified Oswestry Modified Oswestry Low Back Pain Disability Questionnaire: 31 / 50 = 62.0 %   COGNITION: Overall cognitive status: Within functional limits for tasks assessed     SENSATION: WFL  POSTURE: rounded shoulders, forward head, increased thoracic kyphosis, and weight shift right  PALPATION: TTP t/o lumbar SP only with grade 2-3 CPA  LUMBAR ROM:   AROM eval 11/08/23  Flexion 25% avail * WNL  Extension 50% avail WNL  Right lateral flexion To knee jt Past knee joint  Left lateral flexion To knee jt, pulling/stretching sensation, not pain Past knee joint  Right rotation WNL   Left rotation WNL    (Blank rows = not tested)  *= pain in R low back   LOWER EXTREMITY ROM:     Active  Right eval Left eval  Hip flexion    Hip extension    Hip abduction    Hip adduction    Hip internal rotation    Hip external rotation    Knee flexion    Knee extension    Ankle dorsiflexion    Ankle plantarflexion    Ankle inversion    Ankle eversion     (Blank rows = not tested)  * =pain in R low back   LOWER EXTREMITY MMT:    MMT Right eval Left eval  Hip flexion 3+ * 3+ *  Hip extension 4- 4-  Hip abduction 4 4  Hip adduction    Hip internal rotation    Hip external rotation    Knee flexion 4 4  Knee extension 4 4  Ankle dorsiflexion    Ankle plantarflexion    Ankle inversion    Ankle eversion     (Blank rows = not tested)  LUMBAR SPECIAL TESTS:  Straight leg raise test: Negative, Slump test: Negative, and SI Compression/distraction test: Negative Sacral  thrust: negative  FUNCTIONAL TESTS:  2 minute walk test: 374, reports pain in R side low back initially, improves t/o test but still remains t/o  test 30 seconds chair stand test: 6 STS, painful t/o test in R low back, sharp pain 11/08/23:  30 seconds chair stand test 14times pain free no HHA  GAIT: Distance walked: 374 Assistive device utilized: None Level of assistance: Complete Independence Comments: Good speed and step length bilaterally, Mild R lateral lean t/o  TREATMENT DATE:  11/08/23 Reviewed goals SLR test negative Slump test negative  Supine: - Pubic clearing 10x 5" - Bridge 10x  Standing - Squats - Lunges to simulate vacuuming    10/28/23: PT Eval and HEP                                                                                                                                 PATIENT EDUCATION:  Education details: PT evaluation, objective findings, POC, Importance of HEP, Precautions, Clinic policies  Person educated: Patient Education method: Explanation and Demonstration Education comprehension: verbalized understanding and returned demonstration  HOME EXERCISE PROGRAM: Access Code: [email or web address] URL: [email or web address] Date: 10/28/2023 Prepared by: Virgia Griffins Powell-Butler  Exercises - Prone Press Up On Elbows  - 2 x daily - 7 x weekly - 10 reps - 10 hold - Supine Piriformis Stretch with Foot on Ground  - 2 x daily - 7 x weekly - 2 sets - 30 hold, FOCUS ON R PIRIFORMIS  - Supine Lower Trunk Rotation  - 2 x daily - 7 x weekly - 2 sets - 10 reps , KNEES TO LEFT ONLY - Supine Transversus Abdominis Bracing - Hands on Stomach  - 2 x daily - 7 x weekly - 2 sets - 10 reps - 10 hold  11/08/23: - Hooklying Isometric Hip Abduction Adduction with Belt and Ball  - 1 x daily - 7 x weekly - 3 sets - 10 reps - Supine Bridge  - 1 x daily - 7 x weekly - 3 sets - 10 reps - Squat with Chair Touch  - 2 x daily - 7 x weekly - 1 sets - 10 reps - 3" hold  ASSESSMENT:  CLINICAL IMPRESSION: Reviewed goals and educated importance of HEP compliance to assist with strengthening.  Pt reports pain resolved  following shot in SIJ last week and able to complete all ADLs pain free.  Pt presents with lumbar ROM WNL pain free.  Able to complete 14 sit to stands during 30 second STS tests pain free.  Negative with slump and SLR testing.  Reviewed mechanics iwht proper lifting and importance of using legs vs back during ADLs, pt able to demonstrate good form with lunges to simulate vaccumming at home to prevent further injuries.  Pt feels she has improved 100%, no further skilled intervention required.  Eval:Patient is a 58 y.o. female who was seen today for physical therapy evaluation and treatment for  Diagnosis  M54.50 (ICD-10-CM) - Low back pain, unspecified  . Patient reports to PT evaluation with reports of increased low back pain over the last two weeks that increases with forward flexion, hip flexion, and sit to stand transfer. Pain will intermittently travel down RLE to toes at times. Unable to reproduce this pain during today's evaluation but can reproduce patients familiar R low back pain with some motions/testing (see above). SIJ compression and sacral thrust testing negative. On this date, patient demonstrates decreased lumbar ROM, dec LE strength due to pain in low back, and tenderness with grade 2-3 CPA glides in lumbar spine which all may be contributing to patient increased pain, altered gait, and overall impaired function. Patient will benefit from skilled physical therapy in order to address the above deficits to improve overall function.    OBJECTIVE IMPAIRMENTS: Abnormal gait, decreased activity tolerance, decreased endurance, decreased mobility, difficulty walking, decreased ROM, decreased strength, hypomobility, postural dysfunction, and pain.   ACTIVITY LIMITATIONS: lifting, bending, sitting, standing, squatting, stairs, and transfers  PARTICIPATION LIMITATIONS: cleaning, laundry, community activity, occupation, and yard work  [facility] POTENTIAL: Good  CLINICAL DECISION MAKING:  Stable/uncomplicated  EVALUATION COMPLEXITY: Low   GOALS: Goals reviewed with patient? No  SHORT TERM GOALS: Target date: 11/11/23 Patient will be independent with performance of HEP to demonstrate adequate self management of symptoms.  Baseline: 11/08/23: Reports compliance with HEP, able to recall. Goal status: MET  2.   Patient will report at least a 25% improvement with function or pain overall since beginning PT. Baseline: 11/08/23:  Feels she has improved by 100% Goal status: MET   LONG TERM GOALS: Target date: 11/25/23 Patient will improve Oswestry score by  20% in order to improve self-perceived disability and overall function.  Baseline: 11/08/23: 0/50= 0% Goal status: MET   2.  Patient will improve test by at least 50 more ft  in order to demonstrate improved overall endurance.  Baseline: not tested Goal status: N/A   3.  Patient will improve  30 second chair rise  test to  at least 8 STS in order to improve LE strength and endurance for improved gait mechanics. Baseline: 11/08/23:  Able to complete 14 STS pain free Goal status: MET   4.  Patient will gain at least  25% more AROM in lumbar flexion  in order to be able to reach to ground level without increased pain. Baseline: 11/08/23:  Lumbar AROM WNL Goal status: MET   5.  Patient will report overall 50% improvement with daily pain, reporting 3/0 or less, since beginning PT. Baseline: 11/08/23:  Feels she has improved by 100% Goal status: MET   PLAN:  PT FREQUENCY: 1-2x/week  PT DURATION: 4 weeks  PLANNED INTERVENTIONS: 97164- PT Re-evaluation, 97110-Therapeutic exercises, 97530- Therapeutic activity, 97112- Neuromuscular re-education, 97535- Self Care, 16109- Manual therapy, [id number]- Gait training, [phone]- Electrical stimulation (manual), [phone]- Traction (mechanical), Patient/Family education, Balance training, Stair training, Dry Needling, Joint mobilization, Spinal mobilization, and Moist heat.  PLAN FOR NEXT  SESSION: DC to HEP  Minor Amble, LPTA/CLT; CBIS [phone]  4:06 PM, 11/08/23

## 2023-11-22 ENCOUNTER — Encounter (HOSPITAL_COMMUNITY): Admitting: Physical Therapy

## 2023-11-24 ENCOUNTER — Encounter (HOSPITAL_COMMUNITY)

## 2024-02-13 ENCOUNTER — Ambulatory Visit: Admission: EM | Admit: 2024-02-13 | Discharge: 2024-02-13 | Disposition: A | Discharge status: Home / Self Care

## 2024-02-13 DIAGNOSIS — H6011 Cellulitis of right external ear: Secondary | ICD-10-CM

## 2024-02-13 DIAGNOSIS — H61001 Unspecified perichondritis of right external ear: Secondary | ICD-10-CM

## 2024-02-13 MED ORDER — CIPROFLOXACIN HCL 500 MG PO TABS: 500.0000 mg | ORAL_TABLET | Freq: Two times a day (BID) | ORAL | 0 refills | Status: AC

## 2024-02-13 MED ORDER — DEXAMETHASONE SODIUM PHOSPHATE 10 MG/ML IJ SOLN
10.0000 mg | INTRAMUSCULAR | Status: AC
  Administered 2024-02-13: 10 mg via INTRAMUSCULAR

## 2024-02-13 NOTE — ED Provider Notes (Signed)
 [facility]    CSN: [id number] Arrival date & time: 02/13/24  0801      History   Chief Complaint No chief complaint on file.   HPI [name] is a 58 y.o. female.   The history is provided by the patient.   Patient presents with a 2-day history of right ear redness, pain, and swelling.  Patient denies injury, trauma, recent swimming, fever, chills, ear drainage, loss of hearing, nasal congestion, runny nose, or cough.  Patient states this happens every few months.  Patient states that she has not tried any medication for her symptoms outside of Tylenol .  Patient with prior documentation showing how she was treated in the past, states that her PCP would give her a an antibiotic shot and start her on antibiotics.   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) 04/13/2012   Esophageal reflux 04/13/2012    Past Surgical History:  Procedure Laterality Date   ABDOMINAL HYSTERECTOMY  11/2010   CERVICAL DISC SURGERY     COLONOSCOPY  04/13/2012   Procedure: COLONOSCOPY;  Surgeon: Princella CHRISTELLA Nida, MD;  Location: [facility];  Service: Endoscopy;  Laterality: N/A;   ESOPHAGOGASTRODUODENOSCOPY  04/13/2012   Procedure: ESOPHAGOGASTRODUODENOSCOPY (EGD);  Surgeon: Princella CHRISTELLA Nida, MD;  Location: [facility];  Service: Endoscopy;  Laterality: N/A;   LAPAROSCOPY  1990   For endometriosis    SHOULDER ARTHROSCOPY     TUBAL LIGATION  1999    OB History   No obstetric history on file.      Home Medications    Prior to Admission medications   Medication Sig Start Date End Date Taking? Authorizing Provider  losartan  (COZAAR ) 50 MG tablet Take 50 mg by  mouth daily.   Yes [provider]  ciprofloxacin  (CIPRO ) 500 MG tablet Take 1 tablet (500 mg total) by mouth every 12 (twelve) hours. 02/28/22   Raspet, Erin K, PA-C  valACYclovir  (VALTREX ) 500 MG tablet TAKE 4 TABLETS(2000 MG) BY MOUTH 1 TIME. REPEAT 4 PILLS IN 12 HOURS AS NEEDED FOR COLD SORE 11/21/20   Gladis Mustard, FNP    Family History Family History  Problem Relation Age of Onset   Ulcerative colitis Maternal Grandfather    Heart disease Maternal Grandfather    Esophageal cancer Paternal Grandfather    Heart disease Maternal Grandmother    Healthy Mother    Healthy Father    Colon cancer Neg Hx     Social History Social History   Tobacco Use   Smoking status: Every Day    Current packs/day: 0.50    Types: Cigarettes   Smokeless tobacco: Never  Substance Use Topics   Alcohol use: No   Drug use: No     Allergies   Sulfa antibiotics   Review of Systems Review of Systems Per HPI  Physical Exam Triage Vital Signs ED Triage Vitals  Encounter Vitals Group     BP 02/13/24 0820 (!) 142/84     Girls Systolic BP Percentile --      Girls Diastolic BP Percentile --      Boys Systolic BP Percentile --  Boys Diastolic BP Percentile --      Pulse Rate 02/13/24 0820 67     Resp 02/13/24 0820 20     Temp 02/13/24 0820 98 F (36.7 C)     Temp Source 02/13/24 0820 Oral     SpO2 02/13/24 0820 95 %     Weight --      Height --      Head Circumference --      Peak Flow --      Pain Score 02/13/24 0821 5     Pain Loc --      Pain Education --      Exclude from Growth Chart --    No data found.  Updated Vital Signs BP (!) 142/84 (BP Location: Right Arm)   Pulse 67   Temp 98 F (36.7 C) (Oral)   Resp 20   SpO2 95%   Visual Acuity Right Eye Distance:   Left Eye Distance:   Bilateral Distance:    Right Eye Near:   Left Eye Near:    Bilateral Near:     Physical Exam Vitals and nursing note reviewed.  Constitutional:      General: She  is not in acute distress.    Appearance: Normal appearance.  HENT:     Head: Normocephalic.     Right Ear: Hearing normal. No decreased hearing noted. Swelling (External right ear and right ear canal) and tenderness (External right ear and right ear canal) present. No drainage.     Left Ear: Tympanic membrane, ear canal and external ear normal.     Ears:     Comments: Erythema, swelling, and tenderness noted to the external right ear, tragus, pinna and to the right ear canal.  Tenderness palpation surrounding ear but no pain over mastoid. Moderate swelling noted in the canal, unable to visualize the tympanic membrane.  There is no oozing, fluctuance, or drainage present.    Nose: Nose normal.     Mouth/Throat:     Mouth: Mucous membranes are moist.  Eyes:     Extraocular Movements: Extraocular movements intact.     Conjunctiva/sclera: Conjunctivae normal.     Pupils: Pupils are equal, round, and reactive to light.  Cardiovascular:     Rate and Rhythm: Normal rate and regular rhythm.     Pulses: Normal pulses.  Pulmonary:     Effort: Pulmonary effort is normal.     Breath sounds: Normal breath sounds.  Abdominal:     General: Bowel sounds are normal.     Palpations: Abdomen is soft.     Tenderness: There is no abdominal tenderness.  Musculoskeletal:     Cervical back: Normal range of motion.  Skin:    General: Skin is warm and dry.  Neurological:     General: No focal deficit present.     Mental Status: She is alert and oriented to person, place, and time.  Psychiatric:        Mood and Affect: Mood normal.        Behavior: Behavior normal.      UC Treatments / Results  Labs (all labs ordered are listed, but only abnormal results are displayed) Labs Reviewed - No data to display  EKG   Radiology No results found.  Procedures Procedures (including critical care time)  Medications Ordered in UC Medications - No data to display  Initial Impression / Assessment and  Plan / UC Course  I have reviewed the triage vital signs and  the nursing notes.  Pertinent labs & imaging results that were available during my care of the patient were reviewed by me and considered in my medical decision making (see chart for details).  Per review of the chart, patient with prior history of perichondritis.  Given her current presentation, concern for reoccurrence of the left ear.  Her vital signs are stable, she is well-appearing, and is in no acute distress.  Patient reports this is recurring for her.  Decadron  10 mg IM administered for inflammation.  Will start patient on Cipro  500 mg twice daily for cellulitis of the right ear.  Supportive care recommendations were provided and discussed with the patient to include continuing over-the-counter analgesics, ice to help with pain or swelling, and to monitor for worsening.  Patient was given strict follow-up precautions.  Patient was in agreement with this plan of care and verbalizes understanding.  All questions were answered.  Patient stable for discharge.  Final Clinical Impressions(s) / UC Diagnoses   Final diagnoses:  None   Discharge Instructions   None    ED Prescriptions   None    PDMP not reviewed this encounter.   Gilmer Etta PARAS, NP 02/13/24 [phone]

## 2024-02-13 NOTE — Discharge Instructions (Addendum)
 You were given an injection of Decadron  10 mg today. Take medication as prescribed. You may continue over-the-counter Tylenol  or ibuprofen as needed for pain, fever, general discomfort. Apply cool compresses to the right ear to help with pain or swelling.   Do not stick or insert anything inside of the ear while symptoms persist. Please call your insurance company to determine if you need a referral to see ENT.  Please call to schedule an appointment as soon as possible. If you have any spread of pain/redness, fever, nausea, vomiting, weakness or if anything is changing you need to go to the emergency room immediately as we discussed. If symptoms fail to improve over the next 24 to 48 hours, recommend follow-up immediately. Follow-up as needed.

## 2024-02-13 NOTE — ED Triage Notes (Signed)
 Right ear pain started 2 days ago. Patient has not taken any medication at this time.

## 2024-06-15 ENCOUNTER — Ambulatory Visit: Payer: Self-pay

## 2024-06-28 ENCOUNTER — Encounter (INDEPENDENT_AMBULATORY_CARE_PROVIDER_SITE_OTHER): Payer: Self-pay | Admitting: *Deleted

## 2024-07-30 ENCOUNTER — Ambulatory Visit (INDEPENDENT_AMBULATORY_CARE_PROVIDER_SITE_OTHER): Admitting: Gastroenterology

## 2024-09-06 ENCOUNTER — Ambulatory Visit (INDEPENDENT_AMBULATORY_CARE_PROVIDER_SITE_OTHER): Admitting: Gastroenterology
